# Patient Record
Sex: Male | Born: 1950 | Race: White | Hispanic: No | Smoking: Former smoker
Health system: Southern US, Community
[De-identification: ages and names within clinical notes are randomized; demographics above are authoritative.]

## PROBLEM LIST (undated history)

## (undated) DIAGNOSIS — T888XXA Other specified complications of surgical and medical care, not elsewhere classified, initial encounter: Secondary | ICD-10-CM

## (undated) DIAGNOSIS — I639 Cerebral infarction, unspecified: Secondary | ICD-10-CM

## (undated) DIAGNOSIS — I509 Heart failure, unspecified: Secondary | ICD-10-CM

## (undated) DIAGNOSIS — J449 Chronic obstructive pulmonary disease, unspecified: Secondary | ICD-10-CM

## (undated) DIAGNOSIS — G3183 Dementia with Lewy bodies: Secondary | ICD-10-CM

## (undated) DIAGNOSIS — F0280 Dementia in other diseases classified elsewhere without behavioral disturbance: Secondary | ICD-10-CM

## (undated) DIAGNOSIS — N19 Unspecified kidney failure: Secondary | ICD-10-CM

## (undated) DIAGNOSIS — E119 Type 2 diabetes mellitus without complications: Secondary | ICD-10-CM

## (undated) DIAGNOSIS — F200 Paranoid schizophrenia: Secondary | ICD-10-CM

## (undated) DIAGNOSIS — I10 Essential (primary) hypertension: Secondary | ICD-10-CM

## (undated) HISTORY — PX: ANKLE SURGERY: SHX546

---

## 2010-09-15 DIAGNOSIS — N19 Unspecified kidney failure: Secondary | ICD-10-CM

## 2010-09-15 HISTORY — DX: Unspecified kidney failure: N19

## 2013-08-13 ENCOUNTER — Other Ambulatory Visit (HOSPITAL_COMMUNITY): Payer: Medicaid Other

## 2013-08-13 ENCOUNTER — Ambulatory Visit (HOSPITAL_COMMUNITY)
Admission: AD | Admit: 2013-08-13 | Discharge: 2013-08-13 | Disposition: A | Discharge status: Home / Self Care | Payer: Medicare Other | Source: Other Acute Inpatient Hospital | Attending: Internal Medicine | Admitting: Internal Medicine

## 2013-08-13 ENCOUNTER — Inpatient Hospital Stay
Admission: AD | Admit: 2013-08-13 | Discharge: 2013-10-03 | Disposition: A | Discharge status: Skilled Nursing Facility | Payer: Medicaid Other | Source: Ambulatory Visit | Attending: Internal Medicine | Admitting: Internal Medicine

## 2013-08-13 DIAGNOSIS — J9600 Acute respiratory failure, unspecified whether with hypoxia or hypercapnia: Secondary | ICD-10-CM

## 2013-08-13 DIAGNOSIS — R1310 Dysphagia, unspecified: Secondary | ICD-10-CM | POA: Diagnosis present

## 2013-08-13 DIAGNOSIS — R6339 Other feeding difficulties: Secondary | ICD-10-CM | POA: Diagnosis present

## 2013-08-13 DIAGNOSIS — J189 Pneumonia, unspecified organism: Secondary | ICD-10-CM

## 2013-08-13 DIAGNOSIS — R633 Feeding difficulties: Secondary | ICD-10-CM | POA: Diagnosis present

## 2013-08-13 DIAGNOSIS — Z9911 Dependence on respirator [ventilator] status: Secondary | ICD-10-CM | POA: Insufficient documentation

## 2013-08-13 DIAGNOSIS — R0902 Hypoxemia: Secondary | ICD-10-CM

## 2013-08-13 DIAGNOSIS — I639 Cerebral infarction, unspecified: Secondary | ICD-10-CM

## 2013-08-13 DIAGNOSIS — J90 Pleural effusion, not elsewhere classified: Secondary | ICD-10-CM

## 2013-08-13 DIAGNOSIS — Z930 Tracheostomy status: Secondary | ICD-10-CM

## 2013-08-13 DIAGNOSIS — D689 Coagulation defect, unspecified: Secondary | ICD-10-CM

## 2013-08-13 DIAGNOSIS — J811 Chronic pulmonary edema: Secondary | ICD-10-CM

## 2013-08-13 DIAGNOSIS — J948 Other specified pleural conditions: Secondary | ICD-10-CM

## 2013-08-13 DIAGNOSIS — R4182 Altered mental status, unspecified: Secondary | ICD-10-CM

## 2013-08-13 HISTORY — DX: Dementia with Lewy bodies: G31.83

## 2013-08-13 HISTORY — DX: Heart failure, unspecified: I50.9

## 2013-08-13 HISTORY — DX: Paranoid schizophrenia: F20.0

## 2013-08-13 HISTORY — DX: Essential (primary) hypertension: I10

## 2013-08-13 HISTORY — DX: Chronic obstructive pulmonary disease, unspecified: J44.9

## 2013-08-13 HISTORY — DX: Dementia in other diseases classified elsewhere, unspecified severity, without behavioral disturbance, psychotic disturbance, mood disturbance, and anxiety: F02.80

## 2013-08-13 HISTORY — DX: Unspecified kidney failure: N19

## 2013-08-13 HISTORY — DX: Other specified complications of surgical and medical care, not elsewhere classified, initial encounter: T88.8XXA

## 2013-08-13 HISTORY — DX: Type 2 diabetes mellitus without complications: E11.9

## 2013-08-13 HISTORY — DX: Cerebral infarction, unspecified: I63.9

## 2013-08-13 LAB — BLOOD GAS, ARTERIAL
ACID-BASE EXCESS: 1.4 mmol/L (ref 0.0–2.0)
Bicarbonate: 26.1 mEq/L — ABNORMAL HIGH (ref 20.0–24.0)
FIO2: 0.4 %
LHR: 18 {breaths}/min
MECHVT: 450 mL
O2 Saturation: 99.7 %
PCO2 ART: 48.7 mmHg — AB (ref 35.0–45.0)
PEEP: 5 cmH2O
Patient temperature: 100.3
TCO2: 27.6 mmol/L (ref 0–100)
pH, Arterial: 7.354 (ref 7.350–7.450)
pO2, Arterial: 115 mmHg — ABNORMAL HIGH (ref 80.0–100.0)

## 2013-08-14 ENCOUNTER — Encounter: Payer: Self-pay | Admitting: Pulmonary Disease

## 2013-08-14 DIAGNOSIS — R0902 Hypoxemia: Secondary | ICD-10-CM | POA: Diagnosis not present

## 2013-08-14 DIAGNOSIS — I6350 Cerebral infarction due to unspecified occlusion or stenosis of unspecified cerebral artery: Secondary | ICD-10-CM | POA: Diagnosis not present

## 2013-08-14 DIAGNOSIS — J9600 Acute respiratory failure, unspecified whether with hypoxia or hypercapnia: Secondary | ICD-10-CM

## 2013-08-14 DIAGNOSIS — R4182 Altered mental status, unspecified: Secondary | ICD-10-CM

## 2013-08-14 DIAGNOSIS — I639 Cerebral infarction, unspecified: Secondary | ICD-10-CM

## 2013-08-14 LAB — HEPATIC FUNCTION PANEL
ALT: 22 U/L (ref 0–53)
AST: 23 U/L (ref 0–37)
Albumin: 1.1 g/dL — ABNORMAL LOW (ref 3.5–5.2)
Alkaline Phosphatase: 54 U/L (ref 39–117)
Total Bilirubin: 0.4 mg/dL (ref 0.3–1.2)
Total Protein: 5.3 g/dL — ABNORMAL LOW (ref 6.0–8.3)

## 2013-08-14 LAB — BASIC METABOLIC PANEL
BUN: 28 mg/dL — AB (ref 6–23)
CALCIUM: 7.9 mg/dL — AB (ref 8.4–10.5)
CO2: 23 meq/L (ref 19–32)
CREATININE: 1.06 mg/dL (ref 0.50–1.35)
Chloride: 117 mEq/L — ABNORMAL HIGH (ref 96–112)
GFR calc Af Amer: 86 mL/min — ABNORMAL LOW (ref >90)
GFR calc non Af Amer: 74 mL/min — ABNORMAL LOW (ref >90)
Glucose, Bld: 178 mg/dL — ABNORMAL HIGH (ref 70–99)
Potassium: 3.6 mEq/L — ABNORMAL LOW (ref 3.7–5.3)
Sodium: 151 mEq/L — ABNORMAL HIGH (ref 137–147)

## 2013-08-14 LAB — CBC
HCT: 22.5 % — ABNORMAL LOW (ref 39.0–52.0)
Hemoglobin: 7.1 g/dL — ABNORMAL LOW (ref 13.0–17.0)
MCH: 26 pg (ref 26.0–34.0)
MCHC: 31.6 g/dL (ref 30.0–36.0)
MCV: 82.4 fL (ref 78.0–100.0)
PLATELETS: 280 10*3/uL (ref 150–400)
RBC: 2.73 MIL/uL — ABNORMAL LOW (ref 4.22–5.81)
RDW: 18.2 % — AB (ref 11.5–15.5)
WBC: 11.6 10*3/uL — ABNORMAL HIGH (ref 4.0–10.5)

## 2013-08-14 LAB — TSH: TSH: 1.597 u[IU]/mL (ref 0.350–4.500)

## 2013-08-14 LAB — PROCALCITONIN: PROCALCITONIN: 2.01 ng/mL

## 2013-08-14 LAB — PRO B NATRIURETIC PEPTIDE: PRO B NATRI PEPTIDE: 6346 pg/mL — AB (ref 0–125)

## 2013-08-14 MED FILL — Midazolam HCl Inj 5 MG/5ML (Base Equivalent): INTRAMUSCULAR | Qty: 5 | Status: AC

## 2013-08-14 MED FILL — Fentanyl Citrate Inj 0.05 MG/ML: INTRAMUSCULAR | Qty: 2 | Status: AC

## 2013-08-14 NOTE — Consult Note (Signed)
Name: Ralph Rowe MRN: [id number] DOB: [birth year]    ADMISSION DATE:  08/13/2013 CONSULTATION DATE:  08/14/13  REFERRING MD :  [facility] PRIMARY SERVICE:  [facility]   CHIEF COMPLAINT:  Vent mgmt   BRIEF PATIENT DESCRIPTION: 63yo male with hx COPD, CHF, HTN initially admitted to [facility] with pseudomonas, MRSA, PNA requiring intubation.  Course was c/b CVA, diffuse weakness thought r/t guillain barre and given 3 days IVIG.  Unable to wean, tx to [facility] for further vent weaning and rehab.   SIGNIFICANT EVENTS / STUDIES:    LINES / TUBES: ETT 1/19 >>>  CULTURES: Sputum 1/29>>> BCx2 1/28>>> Urine 1/28>>>  ANTIBIOTICS: Per [facility] MD   HISTORY OF PRESENT ILLNESS: 63yo male with hx COPD, CHF, HTN initially admitted to [facility] with pseudomonas, MRSA, PNA requiring intubation.  Course was c/b CVA, diffuse weakness thought r/t guillain barre and given 3 days IVIG.  Unable to wean, tx to [facility] for further vent weaning and rehab.    PAST MEDICAL HISTORY :  Past Medical History  Diagnosis Date  . Paranoid schizophrenia   . COPD (chronic obstructive pulmonary disease)   . CHF (congestive heart failure)   . Diabetes   . HTN (hypertension)   . CVA (cerebral vascular accident)    No past surgical history on file. Prior to Admission medications   Not on File   Allergies not on file  FAMILY HISTORY:  No family history on file. SOCIAL HISTORY:  has no tobacco, alcohol, and drug history on file.  REVIEW OF SYSTEMS:   Unable, pt sedated on vent   SUBJECTIVE:   VITAL SIGNS:  Reviewed.   PHYSICAL EXAMINATION: General:  Chronically ill appearing, sedated and intubated. Neuro:  Sedated and intubated, moves ext to command. HEENT:  [place]/AT, PERRL, EOM-I and MMM. Neck:  Supple, -thyromegally. Cardiovascular:  RRR, Nl S1/S2 and -M/R/G. Lungs:  Coarse BS diffusely. Abdomen:  Soft, NT, ND and +BS. Musculoskeletal:  -edema and -tenderness. Skin:  Intact.   Recent  Labs Lab 08/14/13 0640  NA 151*  K 3.6*  CL 117*  CO2 23  BUN 28*  CREATININE 1.06  GLUCOSE 178*    Recent Labs Lab 08/14/13 0640  HGB 7.1*  HCT 22.5*  WBC 11.6*  PLT 280   Dg Chest Port 1 View  08/13/2013   CLINICAL DATA:  Assess endotracheal tube placement  EXAM: PORTABLE CHEST - 1 VIEW  COMPARISON:  None.  FINDINGS: The lungs are adequately inflated. There are confluent alveolar densities in the right mid and lower lung and in the left lower lobe. The cardiac silhouette is not enlarged. The pulmonary vascularity is not engorged. The left internal jugular venous catheter tip lies in the midportion of the SVC. The endotracheal tube tip lies 5.7 cm above the crotch of the carina. The esophagogastric tube tip projects off the inferior margin of the image.  IMPRESSION: 1. The endotracheal tube tip appears to be in reasonable position approximately 5.7 cm above the crotch of the carina. 2. Confluent alveolar densities in both lungs are consistent with pneumonia. There is no evidence of CHF.   Electronically Signed   By: David  [place]   On: 08/13/2013 18:22   Dg Abd Portable 1v  08/13/2013   CLINICAL DATA:  Nasogastric tube placement  EXAM: PORTABLE ABDOMEN - 1 VIEW  COMPARISON:  None.  FINDINGS: The nasogastric tube tip in proximal port lie below the level of the GE junction likely in the gastric fundus. The  bowel gas pattern is nonspecific where visualized. Motion artifact limits the study.  IMPRESSION: The nasogastric tube is adequately positioned.   Electronically Signed   By: David  [place]   On: 08/13/2013 17:57    ASSESSMENT / PLAN:  Acute respiratory failure - multifactorial in setting CAP (pseudomonas, MRSA) with underlying COPD and CHF.  Now c/b diffuse weakness and deconditioning r/t ?stroke +/- Guillain Barre s/p IVIG rx.   Suspect weakness most likely r/t stroke.   PLAN -  - Cont PS wean as tol, plan to trach early next week. - Limit sedation as able. - F/u CXR. - BD. -  PT/OT efforts. - Likely needs trach if no significant improvement over next few days with IVIG  - Keep dry as tol  - Consider f/u echo but will defer to cards.   Alyson Reedy, M.D. [facility] Pulmonary/Critical Care Medicine. Pager: [phone]. After hours pager: [phone].

## 2013-08-15 ENCOUNTER — Other Ambulatory Visit (HOSPITAL_COMMUNITY): Payer: Medicaid Other

## 2013-08-15 DIAGNOSIS — J9600 Acute respiratory failure, unspecified whether with hypoxia or hypercapnia: Secondary | ICD-10-CM | POA: Diagnosis not present

## 2013-08-15 DIAGNOSIS — J90 Pleural effusion, not elsewhere classified: Secondary | ICD-10-CM

## 2013-08-15 DIAGNOSIS — J948 Other specified pleural conditions: Secondary | ICD-10-CM

## 2013-08-15 NOTE — Consult Note (Signed)
PULMONARY / CRITICAL CARE MEDICINE  Name: Ralph Rowe MRN: [id number] DOB: Aug 23, 1950    ADMISSION DATE:  08/13/2013 CONSULTATION DATE:  08/14/2013  BRIEF PATIENT DESCRIPTION: 63 yo male with COPD, CHF, HTN initially admitted to [facility] with pseudomonas, MRSA, PNA requiring intubation.  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  Unable to wean, [facility] vent weaning and rehab.   SIGNIFICANT EVENTS / STUDIES:  1/19  Respiratory failure / ETT 1/29 Tension pneumothorax / R chest tube placed  CULTURES: 1/29  Sputum >>> 1/28  Blood >>> 1/28  Urine >>>  ANTIBIOTICS:  SUBJECTIVE:  As above - tension pneumothorax / chest tube placed  PHYSICAL EXAMINATION:  Vital signs reviewed   General:  Appears acutely ill, mechanically ventilated, synchronous Neuro:  Encephalopathic, nonfocal, cough / gag diminished HEENT:  OETT  Cardiovascular:  RRR, no m/r/g Lungs:  Bilateral diminished air entry, R chest tube Abdomen:  Soft, nontender, bowel sounds diminished Musculoskeletal:  Moves all extremities, no edema Skin:  Intact   Recent Labs Lab 08/13/13 1620 08/14/13 0640  HGB  --  7.1*  WBC  --  11.6*  PLT  --  280  NA  --  151*  K  --  3.6*  CL  --  117*  CO2  --  23  GLUCOSE  --  178*  BUN  --  28*  CREATININE  --  1.06  CALCIUM  --  7.9*  AST  --  23  ALT  --  22  ALKPHOS  --  54  BILITOT  --  0.4  PROT  --  5.3*  ALBUMIN  --  1.1*  PHART 7.354  --   PCO2ART 48.7*  --   PO2ART 115.0*  --   HCO3 26.1*  --   O2SAT 99.7  --    CXR:  1/29 >>> Hardware in good position, resolution of tension pneumothorax, bibasilar airspace disease   ASSESSMENT:  Acute respiratory failure Pneumonia ( Pseudomonas, MRSA) COPD, no evidence of exacerbation CHF, with possible exacerbation Tension pneumothorax R, s/p chest tube placement Neuro-muscular weakness ( GBS vs post CVA vs ICU  myopathy / neuropathy )  PLAN:  Goal SpO2>92, pH>7.30 Full mechanical support Minimize sedation as able Wean as tolerated Bronchodilators Steroids are not indicated Antibiotics / diuresis per primary team Continue chest tube to sx while on positive pressure ventilation Needs goals of care discussion as likely to require tracheostomy if full care is desired, however overall prognosis of functional recovery is very popor  I have personally obtained history, examined patient, evaluated and interpreted laboratory and imaging results, reviewed medical records, formulated assessment / plan and placed orders.  Lonia FarberZUBELEVITSKIY, Keanan Melander, MD Pulmonary and Critical Care Medicine [facility] Pager: [phone]  08/15/2013, 11:51 AM

## 2013-08-15 NOTE — Procedures (Signed)
Chest Tube Insertion Procedure Note  Indications:  Clinically significant Pneumothorax  Pre-operative Diagnosis: Pneumothorax  Post-operative Diagnosis: Pneumothorax  Procedure Details  Informed consent was obtained for the procedure, including sedation.  Risks of lung perforation, hemorrhage, arrhythmia, and adverse drug reaction were discussed.   After sterile skin prep, using standard technique, a 28 French tube was placed in the right lateral 8th rib space.  Findings: Tension pneumothorax with vigorous air rush. Bloody fluid drainage of approx 1 liter  Estimated Blood Loss:  Minimal         Specimens:  None              Complications:  None; patient tolerated the procedure well.         Condition: stable  Attending Attestation: I performed the procedure.   Ralph Fischeravid Simonds, MD ; [facility] service Mobile [phone].  After 5:30 PM or weekends, call [phone]

## 2013-08-16 ENCOUNTER — Other Ambulatory Visit (HOSPITAL_COMMUNITY): Payer: Medicaid Other

## 2013-08-16 LAB — BASIC METABOLIC PANEL
BUN: 28 mg/dL — ABNORMAL HIGH (ref 6–23)
CALCIUM: 8 mg/dL — AB (ref 8.4–10.5)
CHLORIDE: 114 meq/L — AB (ref 96–112)
CO2: 27 meq/L (ref 19–32)
Creatinine, Ser: 1 mg/dL (ref 0.50–1.35)
GFR calc non Af Amer: 79 mL/min — ABNORMAL LOW (ref >90)
Glucose, Bld: 182 mg/dL — ABNORMAL HIGH (ref 70–99)
Potassium: 3.7 mEq/L (ref 3.7–5.3)
SODIUM: 150 meq/L — AB (ref 137–147)

## 2013-08-16 LAB — BLOOD GAS, ARTERIAL
ACID-BASE EXCESS: 1.1 mmol/L (ref 0.0–2.0)
Bicarbonate: 26.9 mEq/L — ABNORMAL HIGH (ref 20.0–24.0)
FIO2: 0.4 %
LHR: 18 {breaths}/min
O2 SAT: 98.5 %
PATIENT TEMPERATURE: 99.5
PEEP/CPAP: 5 cmH2O
TCO2: 28.6 mmol/L (ref 0–100)
VT: 0.45 mL
pCO2 arterial: 57.5 mmHg (ref 35.0–45.0)
pH, Arterial: 7.294 — ABNORMAL LOW (ref 7.350–7.450)
pO2, Arterial: 124 mmHg — ABNORMAL HIGH (ref 80.0–100.0)

## 2013-08-16 LAB — CBC
HCT: 23.6 % — ABNORMAL LOW (ref 39.0–52.0)
Hemoglobin: 7.6 g/dL — ABNORMAL LOW (ref 13.0–17.0)
MCH: 26.8 pg (ref 26.0–34.0)
MCHC: 32.2 g/dL (ref 30.0–36.0)
MCV: 83.1 fL (ref 78.0–100.0)
PLATELETS: 390 10*3/uL (ref 150–400)
RBC: 2.84 MIL/uL — ABNORMAL LOW (ref 4.22–5.81)
RDW: 18.2 % — AB (ref 11.5–15.5)
WBC: 13.3 10*3/uL — AB (ref 4.0–10.5)

## 2013-08-17 LAB — MALARIA SMEAR

## 2013-08-17 LAB — HIV ANTIBODY (ROUTINE TESTING W REFLEX): HIV: NONREACTIVE

## 2013-08-18 DIAGNOSIS — R4182 Altered mental status, unspecified: Secondary | ICD-10-CM | POA: Diagnosis not present

## 2013-08-18 DIAGNOSIS — J90 Pleural effusion, not elsewhere classified: Secondary | ICD-10-CM | POA: Diagnosis not present

## 2013-08-18 DIAGNOSIS — J9600 Acute respiratory failure, unspecified whether with hypoxia or hypercapnia: Secondary | ICD-10-CM | POA: Diagnosis not present

## 2013-08-18 DIAGNOSIS — R0902 Hypoxemia: Secondary | ICD-10-CM | POA: Diagnosis not present

## 2013-08-18 LAB — BLOOD GAS, ARTERIAL
Acid-Base Excess: 4.5 mmol/L — ABNORMAL HIGH (ref 0.0–2.0)
BICARBONATE: 28.8 meq/L — AB (ref 20.0–24.0)
FIO2: 0.4 %
O2 Saturation: 94.3 %
PATIENT TEMPERATURE: 98
PEEP: 5 cmH2O
RATE: 18 resp/min
TCO2: 30.2 mmol/L (ref 0–100)
VT: 500 mL
pCO2 arterial: 44.9 mmHg (ref 35.0–45.0)
pH, Arterial: 7.422 (ref 7.350–7.450)
pO2, Arterial: 67.5 mmHg — ABNORMAL LOW (ref 80.0–100.0)

## 2013-08-18 LAB — CBC
HCT: 20.9 % — ABNORMAL LOW (ref 39.0–52.0)
Hemoglobin: 6.5 g/dL — CL (ref 13.0–17.0)
MCH: 25.9 pg — ABNORMAL LOW (ref 26.0–34.0)
MCHC: 31.1 g/dL (ref 30.0–36.0)
MCV: 83.3 fL (ref 78.0–100.0)
Platelets: 348 10*3/uL (ref 150–400)
RBC: 2.51 MIL/uL — AB (ref 4.22–5.81)
RDW: 18.3 % — ABNORMAL HIGH (ref 11.5–15.5)
WBC: 10 10*3/uL (ref 4.0–10.5)

## 2013-08-18 LAB — ABO/RH: ABO/RH(D): O POS

## 2013-08-18 LAB — BASIC METABOLIC PANEL
BUN: 29 mg/dL — ABNORMAL HIGH (ref 6–23)
CO2: 27 meq/L (ref 19–32)
CREATININE: 0.9 mg/dL (ref 0.50–1.35)
Calcium: 7.6 mg/dL — ABNORMAL LOW (ref 8.4–10.5)
Chloride: 112 mEq/L (ref 96–112)
GFR calc Af Amer: >90 mL/min (ref >90)
GFR calc non Af Amer: >90 mL/min (ref >90)
GLUCOSE: 233 mg/dL — AB (ref 70–99)
Potassium: 4.5 mEq/L (ref 3.7–5.3)
SODIUM: 148 meq/L — AB (ref 137–147)

## 2013-08-18 LAB — CULTURE, BLOOD (ROUTINE X 2)

## 2013-08-18 LAB — PREPARE RBC (CROSSMATCH)

## 2013-08-18 LAB — PNEUMOCYSTIS JIROVECI SMEAR BY DFA: Pneumocystis jiroveci Ag: NEGATIVE

## 2013-08-18 NOTE — Progress Notes (Signed)
PULMONARY / CRITICAL CARE MEDICINE  Name: Ralph Rowe MRN: [id number] DOB: 1950-10-17    ADMISSION DATE:  08/13/2013 CONSULTATION DATE:  08/14/2013  BRIEF PATIENT DESCRIPTION: 63 yo male with COPD, CHF, HTN initially admitted [facility] with pseudomonas, MRSA, PNA requiring intubation.  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  Unable to wean, [facility] and [facility].   SIGNIFICANT EVENTS / STUDIES:  1/19  Respiratory failure / ETT 1/29 Tension pneumothorax / R chest tube placed  CULTURES: 1/29  Sputum >>> 1/28  Blood >>> 1/28  Urine >>>  ANTIBIOTICS:  SUBJECTIVE:  As above - tension pneumothorax / chest tube placed  PHYSICAL EXAMINATION:  Vital signs reviewed   General:  Appears acutely ill, mechanically ventilated, synchronous Neuro:  Encephalopathic, nonfocal, cough / gag diminished HEENT:  OETT  Cardiovascular:  RRR, no m/r/g Lungs:  Bilateral diminished air entry, R chest tube Abdomen:  Soft, nontender, bowel sounds diminished Musculoskeletal:  Moves all extremities, no edema Skin:  Intact   Recent Labs Lab 08/13/13 1620  08/14/13 0640 08/16/13 0500 08/16/13 1253 08/18/13 0510 08/18/13 0540  HGB  --   --  7.1* 7.6*  --   --  6.5*  WBC  --   --  11.6* 13.3*  --   --  10.0  PLT  --   --  280 390  --   --  348  NA  --   --  151* 150*  --   --  148*  K  --   < > 3.6* 3.7  --   --  4.5  CL  --   --  117* 114*  --   --  112  CO2  --   --  23 27  --   --  27  GLUCOSE  --   --  178* 182*  --   --  233*  BUN  --   --  28* 28*  --   --  29*  CREATININE  --   --  1.06 1.00  --   --  0.90  CALCIUM  --   --  7.9* 8.0*  --   --  7.6*  AST  --   --  23  --   --   --   --   ALT  --   --  22  --   --   --   --   ALKPHOS  --   --  54  --   --   --   --   BILITOT  --   --  0.4  --   --   --   --   PROT  --   --  5.3*  --   --   --   --   ALBUMIN  --   --  1.1*   --   --   --   --   PHART 7.354  --   --   --  7.294* 7.422  --   PCO2ART 48.7*  --   --   --  57.5* 44.9  --   PO2ART 115.0*  --   --   --  124.0* 67.5*  --   HCO3 26.1*  --   --   --  26.9* 28.8*  --   O2SAT 99.7  --   --   --  98.5 94.3  --   < > = values in this interval not displayed. CXR:  1/29 >>> Hardware in good position, resolution of tension pneumothorax, bibasilar airspace disease   ASSESSMENT:  Acute respiratory failure Pneumonia ( Pseudomonas, MRSA) COPD, no evidence of exacerbation CHF, with possible exacerbation Tension pneumothorax R, s/p chest tube placement Neuro-muscular weakness ( GBS vs post CVA vs ICU myopathy / neuropathy )  PLAN: - Goal SpO2>92, pH>7.30 - Full mechanical support until trach. - Plan trach on Wednesday at 10:30 AM. - Minimize sedation as able, specially post trach. - Bronchodilators. - Steroids are not indicated. - Antibiotics / diuresis per primary team. - Continue chest tube to sx while on positive pressure ventilation.  CC time 35 min.  I have personally obtained history, examined patient, evaluated and interpreted laboratory and imaging results, reviewed medical records, formulated assessment / plan and placed orders.  Alyson Reedy, M.D. [facility] Pulmonary/Critical Care Medicine. Pager: [phone]. After hours pager: [phone].

## 2013-08-19 LAB — BASIC METABOLIC PANEL
BUN: 27 mg/dL — AB (ref 6–23)
CALCIUM: 7.3 mg/dL — AB (ref 8.4–10.5)
CHLORIDE: 110 meq/L (ref 96–112)
CO2: 27 meq/L (ref 19–32)
Creatinine, Ser: 0.81 mg/dL (ref 0.50–1.35)
GFR calc Af Amer: >90 mL/min (ref >90)
GFR calc non Af Amer: >90 mL/min (ref >90)
GLUCOSE: 211 mg/dL — AB (ref 70–99)
Potassium: 4.6 mEq/L (ref 3.7–5.3)
Sodium: 146 mEq/L (ref 137–147)

## 2013-08-19 LAB — CBC
HEMATOCRIT: 21.9 % — AB (ref 39.0–52.0)
Hemoglobin: 7.1 g/dL — ABNORMAL LOW (ref 13.0–17.0)
MCH: 27.5 pg (ref 26.0–34.0)
MCHC: 32.4 g/dL (ref 30.0–36.0)
MCV: 84.9 fL (ref 78.0–100.0)
Platelets: 257 10*3/uL (ref 150–400)
RBC: 2.58 MIL/uL — AB (ref 4.22–5.81)
RDW: 17.3 % — ABNORMAL HIGH (ref 11.5–15.5)
WBC: 9.5 10*3/uL (ref 4.0–10.5)

## 2013-08-19 LAB — PREPARE RBC (CROSSMATCH)

## 2013-08-19 LAB — CULTURE, RESPIRATORY W GRAM STAIN

## 2013-08-19 LAB — CULTURE, RESPIRATORY

## 2013-08-20 ENCOUNTER — Other Ambulatory Visit (HOSPITAL_COMMUNITY): Payer: Medicaid Other

## 2013-08-20 ENCOUNTER — Encounter (HOSPITAL_COMMUNITY): Payer: Medicaid Other

## 2013-08-20 DIAGNOSIS — R4182 Altered mental status, unspecified: Secondary | ICD-10-CM | POA: Diagnosis not present

## 2013-08-20 DIAGNOSIS — Z930 Tracheostomy status: Secondary | ICD-10-CM

## 2013-08-20 DIAGNOSIS — I6350 Cerebral infarction due to unspecified occlusion or stenosis of unspecified cerebral artery: Secondary | ICD-10-CM | POA: Diagnosis not present

## 2013-08-20 DIAGNOSIS — J9600 Acute respiratory failure, unspecified whether with hypoxia or hypercapnia: Secondary | ICD-10-CM | POA: Diagnosis not present

## 2013-08-20 DIAGNOSIS — J90 Pleural effusion, not elsewhere classified: Secondary | ICD-10-CM | POA: Diagnosis not present

## 2013-08-20 LAB — TYPE AND SCREEN
ABO/RH(D): O POS
ANTIBODY SCREEN: NEGATIVE
UNIT DIVISION: 0
UNIT DIVISION: 0
UNIT DIVISION: 0
Unit division: 0

## 2013-08-20 LAB — CBC
HCT: 27.1 % — ABNORMAL LOW (ref 39.0–52.0)
HEMOGLOBIN: 9 g/dL — AB (ref 13.0–17.0)
MCH: 28 pg (ref 26.0–34.0)
MCHC: 33.2 g/dL (ref 30.0–36.0)
MCV: 84.2 fL (ref 78.0–100.0)
Platelets: 229 10*3/uL (ref 150–400)
RBC: 3.22 MIL/uL — AB (ref 4.22–5.81)
RDW: 16.8 % — AB (ref 11.5–15.5)
WBC: 11.5 10*3/uL — AB (ref 4.0–10.5)

## 2013-08-20 LAB — BASIC METABOLIC PANEL
BUN: 22 mg/dL (ref 6–23)
CHLORIDE: 104 meq/L (ref 96–112)
CO2: 29 meq/L (ref 19–32)
Calcium: 7.2 mg/dL — ABNORMAL LOW (ref 8.4–10.5)
Creatinine, Ser: 0.64 mg/dL (ref 0.50–1.35)
GFR calc non Af Amer: >90 mL/min (ref >90)
GLUCOSE: 201 mg/dL — AB (ref 70–99)
POTASSIUM: 4.4 meq/L (ref 3.7–5.3)
Sodium: 141 mEq/L (ref 137–147)

## 2013-08-20 LAB — CULTURE, BLOOD (ROUTINE X 2): CULTURE: NO GROWTH

## 2013-08-20 LAB — PROTIME-INR
INR: 1.36 (ref 0.00–1.49)
Prothrombin Time: 16.4 seconds — ABNORMAL HIGH (ref 11.6–15.2)

## 2013-08-20 LAB — APTT: APTT: 51 s — AB (ref 24–37)

## 2013-08-20 NOTE — Procedures (Signed)
Bedside Tracheostomy Insertion Procedure Note   Patient Details:   Name: Ralph Rowe DOB: 15-Apr-1951 MRN: [id number]  Procedure: Tracheostomy  Pre Procedure Assessment: ET Tube Size: 8.0 ET Tube secured at lip (cm):26 Bite block in place: Yes Breath Sounds: Rhonch  Post Procedure Assessment: There were no vitals taken for this visit. O2 sats: stable throughout Complications: No apparent complications Patient did tolerate procedure well Tracheostomy Brand:Shiley Tracheostomy Style:Cuffed Tracheostomy Size: 8.0 Tracheostomy Secured JXB:[id number]:Sutures Tracheostomy Placement Confirmation:Trach cuff visualized and in place and Chest X ray ordered for placement    Leonard DowningFerguson, Hellena Pridgen Steele 08/20/2013, 2:34 PM

## 2013-08-20 NOTE — Procedures (Signed)
Bronchoscopy Procedure Note Lolly MustacheLarry Mckey [id number] Sep 22, 1950  Procedure: Bronchoscopy Indications: Diagnostic evaluation of the airways, Obtain specimens for culture and/or other diagnostic studies and Remove secretions  Procedure Details Consent: Risks of procedure as well as the alternatives and risks of each were explained to the (patient/caregiver).  Consent for procedure obtained. Time Out: Verified patient identification, verified procedure, site/side was marked, verified correct patient position, special equipment/implants available, medications/allergies/relevent history reviewed, required imaging and test results available.  Performed  In preparation for procedure, patient was given 100% FiO2 and bronchoscope lubricated. Sedation: Benzodiazepines  Airway entered and the following bronchi were examined: RUL, RML, RLL, LUL, LLL and Bronchi.   Procedures performed: Brushings performed  - no Bronchoscope removed.  , Patient placed back on 100% FiO2 at conclusion of procedure.    Evaluation Hemodynamic Status: BP stable throughout; O2 sats: stable throughout Patient's Current Condition: stable Specimens:  Sent purulent fluid Complications: No apparent complications Patient did tolerate procedure well.   Nelda BucksFEINSTEIN,Adeleine Pask J. 08/20/2013   1. Pre procedure, thick dark tan secretions obstructing mainstem's and all lobes, suctioned clear 2. BAL RLL dark thick pus 3. Noted placement needle, wire, dilator punch and progressive and then trach No apparent complication Then placed bronch through new trach, no bleeding, placed 4 cm above carina  Mcarthur Rossettianiel J. Tyson AliasFeinstein, MD, FACP Pgr: [phone] [place] Pulmonary & Critical Care

## 2013-08-20 NOTE — Procedures (Signed)
Bedside Percutaneous Tracheostomy Placement  Patient sedated and paralyzed then positioned.  Area cleaned, lidocaine/epi injected.  Skin incision done followed by blunt dissection.  Airway palpated and entered, visualized bronchoscopically.  Airway crushed and dilated.  Size 8 cuffed shiley trach placed without difficulty.  Minimal blood loss.  Visualized bronchoscopically well above the carina.  CXR ordered.  Alyson ReedyWesam G. Yacoub, M.D. [facility] Pulmonary/Critical Care Medicine. Pager: [phone]. After hours pager: [phone].

## 2013-08-20 NOTE — Progress Notes (Signed)
PULMONARY / CRITICAL CARE MEDICINE  Name: Ralph Rowe MRN: [id number] DOB: 08/31/1950    ADMISSION DATE:  08/13/2013 CONSULTATION DATE:  08/14/2013  BRIEF PATIENT DESCRIPTION: 63 yo male with COPD, CHF, HTN initially admitted to [facility] with pseudomonas, MRSA, PNA requiring intubation.  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  Unable to wean, transferred to [facility] for further vent weaning and rehab.   SIGNIFICANT EVENTS / STUDIES:  1/19  Respiratory failure / ETT 1/29 Tension pneumothorax / R chest tube placed  CULTURES: 1/29  Sputum >>> 1/28  Blood >>> 1/28  Urine >>>  ANTIBIOTICS:  SUBJECTIVE: Failed weaning, worsening PTX on CXR.  PHYSICAL EXAMINATION:  Vital signs reviewed   General:  Appears acutely ill, mechanically ventilated, synchronous Neuro:  Encephalopathic, nonfocal, cough / gag diminished HEENT:  OETT  Cardiovascular:  RRR, no m/r/g Lungs:  Bilateral diminished air entry, R chest tube Abdomen:  Soft, nontender, bowel sounds diminished Musculoskeletal:  Moves all extremities, no edema Skin:  Intact   Recent Labs Lab 08/13/13 1620  08/14/13 0640 08/16/13 0500 08/16/13 1253 08/18/13 0510 08/18/13 0540 08/19/13 0500 08/20/13 0820  HGB  --   --  7.1* 7.6*  --   --  6.5* 7.1* 9.0*  WBC  --   --  11.6* 13.3*  --   --  10.0 9.5 11.5*  PLT  --   < > 280 390  --   --  348 257 229  NA  --   --  151* 150*  --   --  148* 146 141  K  --   < > 3.6* 3.7  --   --  4.5 4.6 4.4  CL  --   --  117* 114*  --   --  112 110 104  CO2  --   --  23 27  --   --  27 27 29   GLUCOSE  --   --  178* 182*  --   --  233* 211* 201*  BUN  --   --  28* 28*  --   --  29* 27* 22  CREATININE  --   --  1.06 1.00  --   --  0.90 0.81 0.64  CALCIUM  --   --  7.9* 8.0*  --   --  7.6* 7.3* 7.2*  AST  --   --  23  --   --   --   --   --   --   ALT  --   --  22  --   --   --   --   --   --   ALKPHOS  --   --  54   --   --   --   --   --   --   BILITOT  --   --  0.4  --   --   --   --   --   --   PROT  --   --  5.3*  --   --   --   --   --   --   ALBUMIN  --   --  1.1*  --   --   --   --   --   --   INR  --   --   --   --   --   --   --   --  1.36  APTT  --   --   --   --   --   --   --   --  51*  PHART 7.354  --   --   --  7.294* 7.422  --   --   --   PCO2ART 48.7*  --   --   --  57.5* 44.9  --   --   --   PO2ART 115.0*  --   --   --  124.0* 67.5*  --   --   --   HCO3 26.1*  --   --   --  26.9* 28.8*  --   --   --   O2SAT 99.7  --   --   --  98.5 94.3  --   --   --   < > = values in this interval not displayed. CXR:  1/29 >>> Hardware in good position, resolution of tension pneumothorax, bibasilar airspace disease   ASSESSMENT:  Acute respiratory failure Pneumonia ( Pseudomonas, MRSA) COPD, no evidence of exacerbation CHF, with possible exacerbation Tension pneumothorax R, s/p chest tube placement Neuro-muscular weakness ( GBS vs post CVA vs ICU myopathy / neuropathy )  PLAN: - Goal SpO2>92, pH>7.30 - Trach today. - Begin weaning in AM after trach today. - Increase suction to 30 on CT then repeat CXR for PTX. - Minimize sedation as able, specially post trach. - Bronchodilators. - Steroids are not indicated. - Antibiotics / diuresis per primary team.  CC time 35 min.  I have personally obtained history, examined patient, evaluated and interpreted laboratory and imaging results, reviewed medical records, formulated assessment / plan and placed orders.  Alyson ReedyWesam G. Zamaria Brazzle, M.D. [facility] Pulmonary/Critical Care Medicine. Pager: [phone]. After hours pager: [phone].

## 2013-08-21 ENCOUNTER — Encounter: Payer: Self-pay | Admitting: Physician Assistant

## 2013-08-21 ENCOUNTER — Other Ambulatory Visit (HOSPITAL_COMMUNITY): Payer: Medicaid Other

## 2013-08-21 DIAGNOSIS — I6350 Cerebral infarction due to unspecified occlusion or stenosis of unspecified cerebral artery: Secondary | ICD-10-CM

## 2013-08-21 DIAGNOSIS — R633 Feeding difficulties, unspecified: Secondary | ICD-10-CM

## 2013-08-21 DIAGNOSIS — R4182 Altered mental status, unspecified: Secondary | ICD-10-CM

## 2013-08-21 LAB — CULTURE, RESPIRATORY

## 2013-08-21 LAB — CULTURE, RESPIRATORY W GRAM STAIN

## 2013-08-21 NOTE — Consult Note (Signed)
Patient seen, examined, and I agree with the above documentation, including the assessment and plan. CT also to r/o abdominal varices Follow-up coags which need to improve, with FFP at a minimum, before PEG given risk of bleeding with this procedure

## 2013-08-21 NOTE — Consult Note (Signed)
[facility] Consult: 2:20 PM 08/21/2013  LOS: 8 days    Referring Provider: Dr Zonia Kief  Primary Care Physician:  No PCP Per Patient Primary Gastroenterologist:  Dr. Fabian Sharp in [place]   Reason for Consultation:  Requesting PEG   HPI: Ralph Rowe is a 63 y.o. male.  63 yo male with COPD, CHF, HTN, wheelchair bound, Lewey body dementia.  Admitted 1/8 - 1/28 to [facility] with pseudomonas, MRSA, PNA requiring intubation, rhabdo, + H Fu treated with Tamiflu. . 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 Afib with RVR treated with amiodarone and cardioversion.  Coagulopathy with INR 1.7 per notes.  No anticoagulants in use.  Dark material per NGT was evaluated with EGD showing gastritis, esophagitis, HH.  Treated with PPI.  Unable to wean, transferred to [facility] for further vent weaning and rehab.  Since transfer to [facility] had bronch as well as trach placement 2/4 for worsening PTX, failed weaning.  Growing pseudomonas from sputum. Has required 4 PRBCs (08/18/12) since [facility] Admission.  He requires feeding tube due to protein malnutrition.   IR reluctant to place G tube due to hx of gastritis, GI bleed.  He also has reduceible umbilical hernia on exam.   Charting notes hx of ETOH abuse which ceased in 2011 when he entered long term care facility.  No mention of liver disease or of ascites.   Had been tolerating tube feeds but feeding tube dislodged during bronch/trach 2/4 and was never replaced as plan had been for IR to place G tube today. As mentioned, IR does not feel safe to place G tube. Currently pt about 24 hours without feeding tube, supporting with IVF    Past Medical History  Diagnosis Date  .  Paranoid schizophrenia     electroconvulsive therapy March 2012  . COPD (chronic obstructive pulmonary disease)   . CHF (congestive heart failure)   . Diabetes   . HTN (hypertension)   . CVA (cerebral vascular accident)   . Renal failure as complication of care 09/2010    ARF due to lithium toxicity.   . Dementia with Lewy bodies     Past Surgical History  Procedure Laterality Date  . Ankle surgery Right 2013 . 2014    Prior to Admission medications   Not on File    Scheduled Meds: Fentanyl IV Novolog , Levemir insulin Atorvastatin Clonazepam Digoxin combivent inhaler Levothyroxine Metoprolol Renal MVI Olanzapine Protonix 40 mg IV BID Rivastigmine Vitamin D Zyvox    Allergies as of 08/13/2013  . (Not on File)    Family History Parents:  Senile dementia and Parkinsons  History   Social History  . Marital Status: Unknown    Spouse Name: N/A    Number of Children: N/A  . Years of Education: N/A   Occupational History  . Not on file.   Social History Main Topics  . Smoking status: Not on file  . Smokeless tobacco: Not on file  . Alcohol Use: Not  on file  . Drug Use: Not on file  . Sexual Activity: Not on file    REVIEW OF SYSTEMS: Constitutional:  Not able to ascertain from charts but wheelchair bound before Jan 2015 ENT:  No nose bleeds Pulm:  Per HPI CV:  No palpitations, no LE edema.  GU:  No hematuria, no frequency GI:  Per HPI Heme:  Not known   Transfusions: un known Neuro:  No headaches, no peripheral tingling or numbness Derm:  No itching, no rash or sores.  Endocrine:  No sweats or chills.  No polyuria or dysuria Immunization:  Flu shot in 2014 Travel:  None beyond [place]    PHYSICAL EXAM: Vital signs in last 24 hours: 97.8   114/46   Pulse 98   resp 17  sats  General: comfortable, trach/intubated WM.  Not toxic Head:  No asymmetry  Eyes:  No icterus or pallor Ears:  Does not appear to be HOH  Nose:  No discharge, panda  tube in place Mouth:  No bleeding Neck:  Trach in place, no bleeding Lungs:  Clear in front.  No labored respiration Heart: RRR Abdomen:  Obese, ND, no shifting dullness, no HSM.  Reducible umbilical hernia.  Active BS.   Rectal: deferred   Musc/Skeltl: deformity in both feet, look like Charcot-type structural deformities.  Extremities:  Non pitting edema in arms and hands  Neurologic:   Not able to wiggle fingers on right hand Skin:  Macerated fungal rash in groin, scrtoum Tattoos:  None seen Nodes:  No cervical adenopathy   Psych:  Relaxed.  Both wrist tied with soft restraints.   Intake/Output from previous day:   Intake/Output this shift:    LAB RESULTS:  Recent Labs  08/19/13 0500 08/20/13 0820  WBC 9.5 11.5*  HGB 7.1* 9.0*  HCT 21.9* 27.1*  PLT 257 229   BMET Lab Results  Component Value Date   NA 141 08/20/2013   NA 146 08/19/2013   NA 148* 08/18/2013   K 4.4 08/20/2013   K 4.6 08/19/2013   K 4.5 08/18/2013   CL 104 08/20/2013   CL 110 08/19/2013   CL 112 08/18/2013   CO2 29 08/20/2013   CO2 27 08/19/2013   CO2 27 08/18/2013   GLUCOSE 201* 08/20/2013   GLUCOSE 211* 08/19/2013   GLUCOSE 233* 08/18/2013   BUN 22 08/20/2013   BUN 27* 08/19/2013   BUN 29* 08/18/2013   CREATININE 0.64 08/20/2013   CREATININE 0.81 08/19/2013   CREATININE 0.90 08/18/2013   CALCIUM 7.2* 08/20/2013   CALCIUM 7.3* 08/19/2013   CALCIUM 7.6* 08/18/2013   LFT No results found for this basename: PROT, ALBUMIN, AST, ALT, ALKPHOS, BILITOT, BILIDIR, IBILI,  in the last 72 hours PT/INR Lab Results  Component Value Date   INR 1.36 08/20/2013       Protime                   16.4      PTT                         51   RADIOLOGY STUDIES: Dg Chest Port 1 View  08/20/2013   CLINICAL DATA:  COPD.  EXAM: PORTABLE CHEST - 1 VIEW  COMPARISON:  DG CHEST 1V PORT dated 08/20/2013  FINDINGS: Tracheostomy tube noted in good anatomic position. Left IJ line and right chest tube in stable position. NG tube removed. Right base pneumothorax is  again noted. This appears less prominent on today's exam. A separate chest tube over the right base may be present. The lower chest is incompletely imaged. Dense atelectasis and/or infiltrates present in the right lower lobe. There is cardiomegaly with bilateral pulmonary alveolar infiltrates and pulmonary venous congestion suggesting congestive heart failure. Small left pleural effusion may be present.  IMPRESSION: 1. Persistent right base pneumothorax, slightly smaller than prior exam. Right upper chest tube noted in stable position. It cannot be determined if a second chest tube is present over the right lower chest. The lower portions of the chest not completely imaged. 2. Dense right base atelectasis. 3. Findings suggesting congestive heart failure with bilateral pulmonary edema and pleural effusions. Superimposed pneumonia may be present . 4. Tracheostomy tube and central line in stable position. NG tube has been removed.   Electronically Signed   By: Maisie Fushomas  Register   On: 08/20/2013 16:18   Dg Chest Port 1 View  08/20/2013   CLINICAL DATA:  Post tracheotomy  EXAM: PORTABLE CHEST - 1 VIEW  COMPARISON:  Portable exam 1200 hr compared to 08/16/2013  FINDINGS: New tracheostomy tube with tip projecting 4.6 cm above carina.  Left jugular central venous catheter tip projecting over SVC.  Nasogastric tube extends only to the distal esophagus, recommend advancing tube 15 cm to place proximal side-port at the stomach.  Right thoracostomy tube present.  Stable heart size.  Pulmonary vascular congestion.  Extensive bibasilar opacities again identified.  New pneumothorax at right base with significant atelectasis of the lower right lung.  No mediastinal shift/tension.  Bones unremarkable.  IMPRESSION: New large right basilar pneumothorax despite right thoracostomy tube with significant atelectasis at lower right lung but no evidence of tension pneumothorax/mediastinal shift.  Persistent bibasilar infiltrates  Tip of  nasogastric tube projects over the distal esophagus, recommend advancing tube 15 cm to place proximal slight poor within the stomach.  Critical Value/emergent results were called by telephone at the time of interpretation on 08/20/2013 at 12:19 PM to Dr. Sharyon MedicusHijazi, who verbally acknowledged these results.   Electronically Signed   By: Ulyses SouthwardMark  Boles M.D.   On: 08/20/2013 12:21    ENDOSCOPIC STUDIES: 07/2012 in [place], per HPI.  Actual report not in records.   IMPRESSION:   *  Dysphagia  *  Transfusion requiring anemia.  4 units since admission 1/28.   *  Hem + NG aspirate, EGD 07/2013 with esophagitis, gastritis, HH per [facility].   *  Vent dependent resp failure post HCAP/MRSA/pseudomonas:  S/p Trach.  Chest tube for PTX  *  Coagulopathy.  No mention of liver disease, chart mentions hx of ETOH abuse before 2011, no mention of portal gastropathy or varices in [place] records.   *  Umbilical hernia, reducible  *  Candidal rash in groin  *  Lewy body dementia, paranoid pcizophrenia     PLAN:     *  Give subq vitamin K and recheck coags tomorrow *  Obtain CT scan to image abdomen:  Assess umbilical hernia, rule out ascites   Jennye MoccasinSarah Gevork Ayyad  08/21/2013, 2:20 PM Pager: [phone]

## 2013-08-22 ENCOUNTER — Other Ambulatory Visit (HOSPITAL_COMMUNITY): Payer: Medicaid Other

## 2013-08-22 DIAGNOSIS — D689 Coagulation defect, unspecified: Secondary | ICD-10-CM

## 2013-08-22 DIAGNOSIS — Z930 Tracheostomy status: Secondary | ICD-10-CM

## 2013-08-22 DIAGNOSIS — R0902 Hypoxemia: Secondary | ICD-10-CM

## 2013-08-22 LAB — CULTURE, BLOOD (ROUTINE X 2)
CULTURE: NO GROWTH
CULTURE: NO GROWTH

## 2013-08-22 LAB — PROTIME-INR
INR: 1.52 — ABNORMAL HIGH (ref 0.00–1.49)
PROTHROMBIN TIME: 17.9 s — AB (ref 11.6–15.2)

## 2013-08-22 LAB — BASIC METABOLIC PANEL
BUN: 14 mg/dL (ref 6–23)
CHLORIDE: 96 meq/L (ref 96–112)
CO2: 27 meq/L (ref 19–32)
Calcium: 7.4 mg/dL — ABNORMAL LOW (ref 8.4–10.5)
Creatinine, Ser: 0.61 mg/dL (ref 0.50–1.35)
GFR calc Af Amer: >90 mL/min (ref >90)
GFR calc non Af Amer: >90 mL/min (ref >90)
Glucose, Bld: 74 mg/dL (ref 70–99)
POTASSIUM: 4.4 meq/L (ref 3.7–5.3)
SODIUM: 136 meq/L — AB (ref 137–147)

## 2013-08-22 LAB — APTT: APTT: 74 s — AB (ref 24–37)

## 2013-08-22 LAB — CBC
HCT: 28.2 % — ABNORMAL LOW (ref 39.0–52.0)
Hemoglobin: 9.2 g/dL — ABNORMAL LOW (ref 13.0–17.0)
MCH: 28.4 pg (ref 26.0–34.0)
MCHC: 32.6 g/dL (ref 30.0–36.0)
MCV: 87 fL (ref 78.0–100.0)
PLATELETS: 233 10*3/uL (ref 150–400)
RBC: 3.24 MIL/uL — AB (ref 4.22–5.81)
RDW: 17.7 % — ABNORMAL HIGH (ref 11.5–15.5)
WBC: 20.5 10*3/uL — ABNORMAL HIGH (ref 4.0–10.5)

## 2013-08-22 LAB — CULTURE, RESPIRATORY

## 2013-08-22 LAB — CULTURE, RESPIRATORY W GRAM STAIN

## 2013-08-22 MED ORDER — IOHEXOL 300 MG/ML  SOLN
100.0000 mL | Freq: Once | INTRAMUSCULAR | Status: AC | PRN
  Administered 2013-08-22: 80 mL via INTRAVENOUS

## 2013-08-22 NOTE — Progress Notes (Signed)
PULMONARY / CRITICAL CARE MEDICINE  Name: Ralph Rowe MRN: [id number] DOB: 02/17/1951    ADMISSION DATE:  08/13/2013 CONSULTATION DATE:  08/14/2013  BRIEF PATIENT DESCRIPTION: 63 yo male with COPD, CHF, HTN [facility] with pseudomonas, MRSA, PNA requiring intubation.  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  Unable to wean, [facility] we[facility] and rehab.   SIGNIFICANT EVENTS / STUDIES:  1/19  Respiratory failure / ETT 1/29 Tension pneumothorax / R chest tube placed  CULTURES: 1/29  Sputum >>> 1/28  Blood >>> 1/28  Urine >>>  ANTIBIOTICS:  SUBJECTIVE: CXR improving on 30cm H2O, comfortable, PSV for 1hr 20 min this morning  PHYSICAL EXAMINATION:  Vital signs reviewed   General: comfortable on vent HEENT: Trach in place, c/d/i PULM: CTA B CV: n1 S1/S2 AB: BS+, soft Ext: edema noted throughout  Neuro: nods head to questions appropriately   Recent Labs Lab 08/16/13 0500 08/16/13 1253 08/18/13 0510 08/18/13 0540 08/19/13 0500 08/20/13 0820 08/22/13 0600  HGB 7.6*  --   --  6.5* 7.1* 9.0* 9.2*  WBC 13.3*  --   --  10.0 9.5 11.5* 20.5*  PLT 390  --   --  348 257 229 233  NA 150*  --   --  148* 146 141  --   K 3.7  --   --  4.5 4.6 4.4  --   CL 114*  --   --  112 110 104  --   CO2 27  --   --  27 27 29   --   GLUCOSE 182*  --   --  233* 211* 201*  --   BUN 28*  --   --  29* 27* 22  --   CREATININE 1.00  --   --  0.90 0.81 0.64  --   CALCIUM 8.0*  --   --  7.6* 7.3* 7.2*  --   INR  --   --   --   --   --  1.36  --   APTT  --   --   --   --   --  51*  --   PHART  --  7.294* 7.422  --   --   --   --   PCO2ART  --  57.5* 44.9  --   --   --   --   PO2ART  --  124.0* 67.5*  --   --   --   --   HCO3  --  26.9* 28.8*  --   --   --   --   O2SAT  --  98.5 94.3  --   --   --   --    CXR:  1/29 >>> Hardware in good position, resolution of tension  pneumothorax, bibasilar airspace disease   ASSESSMENT:  Acute respiratory failure Pneumonia ( Pseudomonas, MRSA) COPD, no evidence of exacerbation CHF, with possible exacerbation Tension pneumothorax R> improved on 30cm suction Neuro-muscular weakness ( GBS vs post CVA vs ICU myopathy / neuropathy )  PLAN: - continue weaning per protocol - Maintain suction to 30 on CT through weekend, likely water seal 2/9 - Daily CXR then repeat CXR for PTX. - Minimize sedation as able. - Bronchodilators. - Steroids are not indicated. - Antibiotics / diuresis per primary team.  Max FickleMCQUAID, DOUGLAS [facility] PCCM Pager: [phone] Cell: [phone] If no  response, call [phone]

## 2013-08-22 NOTE — Progress Notes (Signed)
Patient seen, examined, and I agree with the above documentation, including the assessment and plan. CT abd pelvis reviewed, regarding possible PEG there is a small amount of perihepatic ascites which would likely not impede PEG placement. There is however, a 5 cm, ventral hernia, which may pose problematic regarding PEG placement. Coags also not normal making bleeding post-PEG a possibility  Once coags improve to very near normal (PT and PTT), then PEG can be considered EGD would be done 1st to ensure transillumination from the gastric wall to the skin.  If transillumination is not possible due to habitus or other reason, then GI placed PEG will not be possible Will re-evaluate pt on Monday

## 2013-08-22 NOTE — Progress Notes (Signed)
[facility] Progress Note  Subjective:  Awaiting CT scan.  No acute issues O/N, but labs this AM show a new leukocytosis of 20.5K.  Objective:  Vital signs in last 24 hours:   98.6-61-23-107/49-99% General:  Chronically ill-appearing; comfortable.  Has trach. Heart:  Regular rate and rhythm; no murmurs Pulm:  Clear; mechanical ventilation sounds noted. Abdomen:  Soft, non-distended. Normal bowel sounds.  Non-tender.  Extremities:  Without edema in lower extremities, but non-pitting edema in arms and hands. Neurologic:  Alert and  oriented x4;  grossly normal neurologically. Psych:  Alert.  Nods head and blinks eyes appropriately to respond to questions.  Lab Results:  Recent Labs  08/20/13 0820 08/22/13 0600  WBC 11.5* 20.5*  HGB 9.0* 9.2*  HCT 27.1* 28.2*  PLT 229 233   BMET  Recent Labs  08/20/13 0820 08/22/13 0600  NA 141 136*  K 4.4 4.4  CL 104 96  CO2 29 27  GLUCOSE 201* 74  BUN 22 14  CREATININE 0.64 0.61  CALCIUM 7.2* 7.4*   PT/INR  Recent Labs  08/20/13 0820 08/22/13 0600  LABPROT 16.4* 17.9*  INR 1.36 1.52*   Dg Chest Port 1 View  08/21/2013   CLINICAL DATA:  Respiratory failure  EXAM: PORTABLE CHEST - 1 VIEW  COMPARISON:  08/20/2013  FINDINGS: Postsurgical changes are noted with chest tube in position on the right. Right basilar pneumothorax is again identified. Right basilar infiltrative changes are again seen similar to that noted on the prior exam. Some left basilar atelectasis is noted as well. Tracheostomy catheter and left-sided jugular line are stable.  IMPRESSION: No change from the prior exam.   Electronically Signed   By: Alcide Clever M.D.   On: 08/21/2013 16:00   Dg Chest Port 1 View  08/20/2013   CLINICAL DATA:  COPD.  EXAM: PORTABLE CHEST - 1 VIEW  COMPARISON:  DG CHEST 1V PORT dated 08/20/2013  FINDINGS: Tracheostomy tube noted in good anatomic position. Left IJ line and right chest tube in stable position. NG tube removed. Right  base pneumothorax is again noted. This appears less prominent on today's exam. A separate chest tube over the right base may be present. The lower chest is incompletely imaged. Dense atelectasis and/or infiltrates present in the right lower lobe. There is cardiomegaly with bilateral pulmonary alveolar infiltrates and pulmonary venous congestion suggesting congestive heart failure. Small left pleural effusion may be present.  IMPRESSION: 1. Persistent right base pneumothorax, slightly smaller than prior exam. Right upper chest tube noted in stable position. It cannot be determined if a second chest tube is present over the right lower chest. The lower portions of the chest not completely imaged. 2. Dense right base atelectasis. 3. Findings suggesting congestive heart failure with bilateral pulmonary edema and pleural effusions. Superimposed pneumonia may be present . 4. Tracheostomy tube and central line in stable position. NG tube has been removed.   Electronically Signed   By: Maisie Fus  Register   On: 08/20/2013 16:18   Dg Chest Port 1 View  08/20/2013   CLINICAL DATA:  Post tracheotomy  EXAM: PORTABLE CHEST - 1 VIEW  COMPARISON:  Portable exam 1200 hr compared to 08/16/2013  FINDINGS: New tracheostomy tube with tip projecting 4.6 cm above carina.  Left jugular central venous catheter tip projecting over SVC.  Nasogastric tube extends only to the distal esophagus, recommend advancing tube 15 cm to place proximal side-port at the stomach.  Right thoracostomy tube present.  Stable heart size.  Pulmonary vascular congestion.  Extensive bibasilar opacities again identified.  New pneumothorax at right base with significant atelectasis of the lower right lung.  No mediastinal shift/tension.  Bones unremarkable.  IMPRESSION: New large right basilar pneumothorax despite right thoracostomy tube with significant atelectasis at lower right lung but no evidence of tension pneumothorax/mediastinal shift.  Persistent bibasilar  infiltrates  Tip of nasogastric tube projects over the distal esophagus, recommend advancing tube 15 cm to place proximal slight poor within the stomach.  Critical Value/emergent results were called by telephone at the time of interpretation on 08/20/2013 at 12:19 PM to Dr. Sharyon MedicusHijazi, who verbally acknowledged these results.   Electronically Signed   By: Ulyses SouthwardMark  Boles M.D.   On: 08/20/2013 12:21   Dg Abd Portable 1v  08/21/2013   CLINICAL DATA:  NG tube placement  EXAM: PORTABLE ABDOMEN - 1 VIEW  COMPARISON:  DG ABD PORTABLE 1V dated 08/21/2013  FINDINGS: NG tube is placed with tip in the gastric fundus in good position. Bilateral pleural effusions and basilar atelectasis.  IMPRESSION: NG tube with tip in the gastric fundus.   Electronically Signed   By: Genevive BiStewart  Edmunds M.D.   On: 08/21/2013 21:58   Dg Abd Portable 1v  08/21/2013   CLINICAL DATA:  NG tube  placement  EXAM: PORTABLE ABDOMEN - 1 VIEW  COMPARISON:  DG ABD PORTABLE 1V dated 08/13/2013  FINDINGS: NG tube is not evident on the abdominal radiograph. Recommend replacement.  IMPRESSION: NG tube is not evident on the abdominal radiograph.  Findings conveyed toMitzzi , RNon 08/21/2013  at20:15.   Electronically Signed   By: Genevive BiStewart  Edmunds M.D.   On: 08/21/2013 20:18    Assessment / Plan: *Dysphagia:  Questioning possibility of PEG.  *Transfusion requiring anemia. 4 units since admission 1/28.  *Hem + NG aspirate, EGD 07/2013 with esophagitis, gastritis, HH per [facility].  *Vent dependent resp failure post HCAP/MRSA/pseudomonas: S/p Trach. Chest tube for PTX.  *Coagulopathy despite dose of Vitamin K. No mention of liver disease, chart mentions hx of ETOH abuse before 2011, no mention of portal gastropathy or varices in [place].  *Umbilical hernia, reducible  *Candidal rash in groin  *Lewy body dementia, paranoid schizophrenia *New leukocytosis of 20.5K seen on today's labs.  -Await results of CT scan to rule out abdominal  varices prior to making decision regarding PEG placement.    LOS: 9 days   Elwood Bazinet D.  08/22/2013, 11:06 AM  Pager number [phone]

## 2013-08-24 LAB — CBC
HCT: 24.9 % — ABNORMAL LOW (ref 39.0–52.0)
Hemoglobin: 8.2 g/dL — ABNORMAL LOW (ref 13.0–17.0)
MCH: 28.4 pg (ref 26.0–34.0)
MCHC: 32.9 g/dL (ref 30.0–36.0)
MCV: 86.2 fL (ref 78.0–100.0)
PLATELETS: 230 10*3/uL (ref 150–400)
RBC: 2.89 MIL/uL — AB (ref 4.22–5.81)
RDW: 18.1 % — AB (ref 11.5–15.5)
WBC: 16.6 10*3/uL — AB (ref 4.0–10.5)

## 2013-08-24 LAB — BASIC METABOLIC PANEL
BUN: 11 mg/dL (ref 6–23)
CALCIUM: 7.5 mg/dL — AB (ref 8.4–10.5)
CHLORIDE: 101 meq/L (ref 96–112)
CO2: 25 mEq/L (ref 19–32)
Creatinine, Ser: 0.58 mg/dL (ref 0.50–1.35)
GFR calc non Af Amer: >90 mL/min (ref >90)
Glucose, Bld: 87 mg/dL (ref 70–99)
Potassium: 4.1 mEq/L (ref 3.7–5.3)
Sodium: 139 mEq/L (ref 137–147)

## 2013-08-25 ENCOUNTER — Other Ambulatory Visit (HOSPITAL_COMMUNITY): Payer: Medicaid Other

## 2013-08-25 DIAGNOSIS — R4182 Altered mental status, unspecified: Secondary | ICD-10-CM | POA: Diagnosis not present

## 2013-08-25 DIAGNOSIS — R6339 Other feeding difficulties: Secondary | ICD-10-CM | POA: Diagnosis present

## 2013-08-25 DIAGNOSIS — R1310 Dysphagia, unspecified: Secondary | ICD-10-CM | POA: Diagnosis present

## 2013-08-25 DIAGNOSIS — R633 Feeding difficulties: Secondary | ICD-10-CM | POA: Diagnosis present

## 2013-08-25 DIAGNOSIS — I6350 Cerebral infarction due to unspecified occlusion or stenosis of unspecified cerebral artery: Secondary | ICD-10-CM | POA: Diagnosis not present

## 2013-08-25 DIAGNOSIS — J9600 Acute respiratory failure, unspecified whether with hypoxia or hypercapnia: Secondary | ICD-10-CM | POA: Diagnosis not present

## 2013-08-25 MED ORDER — IOHEXOL 300 MG/ML  SOLN
80.0000 mL | Freq: Once | INTRAMUSCULAR | Status: AC | PRN
  Administered 2013-08-25: 80 mL via INTRAVENOUS

## 2013-08-25 NOTE — Progress Notes (Signed)
PULMONARY / CRITICAL CARE MEDICINE  Name: Ralph Rowe MRN: [id number] DOB: 1951/02/05    ADMISSION DATE:  08/13/2013 CONSULTATION DATE:  08/14/2013  BRIEF PATIENT DESCRIPTION: 63 yo male with COPD, CHF, HTN initially admitted to [facility] with pseudomonas, MRSA, PNA requiring intubation.  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  Unable to wean, transferred to [facility] for further vent weaning and rehab.   SIGNIFICANT EVENTS / STUDIES:  1/19  Respiratory failure / ETT 1/29 Tension pneumothorax / R chest tube placed  CULTURES: Per ssh  ANTIBIOTICS:  SUBJECTIVE: Rt ct clotted off.  PHYSICAL EXAMINATION:  Vital signs reviewed. Abnormal values will appear under impression plan section.    General: comfortable on vent HEENT: Trach in place, c/d/i PULM: decreased bs bases. Rt CT no air leak and no drainage from tube but leaking around tube.  CV: n1 S1/S2 AB: BS+, soft Ext: edema noted throughout  Neuro:Lethargic   Recent Labs Lab 08/19/13 0500 08/20/13 0820 08/22/13 0600 08/24/13 0500  HGB 7.1* 9.0* 9.2* 8.2*  WBC 9.5 11.5* 20.5* 16.6*  PLT 257 229 233 230  NA 146 141 136* 139  K 4.6 4.4 4.4 4.1  CL 110 104 96 101  CO2 27 29 27 25   GLUCOSE 211* 201* 74 87  BUN 27* 22 14 11   CREATININE 0.81 0.64 0.61 0.58  CALCIUM 7.3* 7.2* 7.4* 7.5*  INR  --  1.36 1.52*  --   APTT  --  51* 74*  --    Dg Chest Port 1 View  08/25/2013   CLINICAL DATA:  Pneumothorax, chest tube  EXAM: PORTABLE CHEST - 1 VIEW  COMPARISON:  DG CHEST 1V PORT dated 08/21/2013; DG CHEST 1V PORT dated 08/20/2013; DG CHEST 1V PORT dated 08/20/2013; CT CHEST W/O CM dated 08/15/2013  FINDINGS: There is a right-sided chest tube directed towards the apex. There is a right basilar pneumothorax. There is right mid lung opacity which is unchanged from the prior exam. There is bilateral interstitial and alveolar airspace opacities. There is a small left  pleural effusion. The tracheostomy tube is in unchanged position. There is a nasogastric tube and a left-sided subclavian central venous catheter in unchanged positions. Stable cardiomediastinal silhouette.  IMPRESSION: Right-sided chest tube directed towards the apex with a right basilar pneumothorax.  Bilateral interstitial and alveolar airspace opacities unchanged in the prior exam. This may reflect multi lobar pneumonia versus pulmonary edema.   Electronically Signed   By: Elige Ko   On: 08/25/2013 12:25    ASSESSMENT:  Acute respiratory failure Pneumonia ( Pseudomonas, MRSA) COPD, no evidence of exacerbation CHF, with possible exacerbation Tension pneumothorax R> improved on 30cm suction, now clotted of 2/9 Neuro-muscular weakness ( GBS vs post CVA vs ICU myopathy / neuropathy ) Lung necrosis, PNA  PLAN: -malfunctioning CT now, CT done to assess, needs to be TPA or replaced. Attempt tpa first - Daily CXR then repeat CXR for PTX this afternoon - I am unsure if this lung is trap and will come up with or without CT, it has improved aeration to some extent with now infiltrate and necrosis -clinically appears well currently with current Chest tube malfxn - Minimize sedation as able. - Bronchodilators. - Steroids are not indicated. - Antibiotics / diuresis per primary team. - CT prelim appears much better as far as PTX, still with necrosis, effusion smaller -will discuss current ABX regimen, consider clinda  Brett Canales Minor  ACNP [facility] P[facility]M Pager [phone] till 3 pm If no answer page [phone] 08/25/2013, 1:44 PM  I have fully examined this patient and agree with above findings.    And edited in full  Mcarthur RossettiDaniel J. Tyson AliasFeinstein, MD, FACP Pgr: [phone] [facility]'

## 2013-08-25 NOTE — Progress Notes (Signed)
[facility] Progress Note  Subjective:  Going for CT of chest shortly.  Failed weaning today.  Objective:  Vital signs in last 24 hours:  98.9-72-16-107/60-100% General:  Alert, chronically ill-appearing, in NAD; has trach Heart:  Regular rate and rhythm; no murmurs Pulm:  Clear. Abdomen:  Soft, non-distended. Normal bowel sounds.  Non-tender.  Reducible ventral hernia noted. Extremities:  Without edema in lower extremities.  Non-pitting edema noted in arms and hands. Neurologic:  Alert; nods head and blinks eyes to questions.  Lab Results:  Recent Labs  08/24/13 0500  WBC 16.6*  HGB 8.2*  HCT 24.9*  PLT 230   BMET  Recent Labs  08/24/13 0500  NA 139  K 4.1  CL 101  CO2 25  GLUCOSE 87  BUN 11  CREATININE 0.58  CALCIUM 7.5*   Assessment / Plan: *Dysphagia: Questioning possibility of PEG.  CT shows 5 cm ventral hernia.  Dr. Leone PayorGessner would like to review CT images with radiology.  *Transfusion requiring anemia. 4 units since admission 1/28.  Hgb down slightly (1 gram) this AM.  *Hem + NG aspirate earlier in admission, EGD 07/2013 with esophagitis, gastritis, HH [facility]  *Vent dependent resp failure post HCAP/MRSA/pseudomonas: S/p Trach. Chest tube for PTX.  Have CT chest today.  *Coagulopathy despite dose of Vitamin K. No cirrhosis or abdominal varices seen on CT scan.  No PTT or PT/INR ordered since 2/6.  Will recheck those today since they will need to be corrected prior to any PEG placement.  *Umbilical hernia, reducible  *Candidal rash in groin  *Lewy body dementia, paranoid schizophrenia   LOS: 12 days   ZEHR, JESSICA D.  08/25/2013, 9:12 AM  Pager number [phone]   [facility] Attending  I have assessed the patient and agree with the above note. Need to recheck coags. Reviewed CT with radiologist and I think anatomic situation is such that PEG can be attempted - cannot guarantee it will be successful - but must have coags  normal first.  Iva Booparl E. Emely Fahy, MD, [facility] [phone] (pager) 08/25/2013 2:32 PM

## 2013-08-26 ENCOUNTER — Other Ambulatory Visit (HOSPITAL_COMMUNITY): Payer: Medicaid Other

## 2013-08-26 DIAGNOSIS — R1310 Dysphagia, unspecified: Secondary | ICD-10-CM | POA: Diagnosis not present

## 2013-08-26 DIAGNOSIS — R633 Feeding difficulties, unspecified: Secondary | ICD-10-CM | POA: Diagnosis not present

## 2013-08-26 LAB — PROTIME-INR
INR: 1.69 — ABNORMAL HIGH (ref 0.00–1.49)
Prothrombin Time: 19.4 seconds — ABNORMAL HIGH (ref 11.6–15.2)

## 2013-08-26 LAB — APTT: aPTT: 55 seconds — ABNORMAL HIGH (ref 24–37)

## 2013-08-26 MED ORDER — VITAMIN K1 10 MG/ML IJ SOLN: 5.0000 mg | Freq: Once | INTRAMUSCULAR | Status: DC

## 2013-08-26 NOTE — Progress Notes (Signed)
PULMONARY / CRITICAL CARE MEDICINE  Name: Ralph Rowe MRN: [id number] DOB: 11-Jan-1951    ADMISSION DATE:  08/13/2013 CONSULTATION DATE:  08/14/2013  BRIEF PATIENT DESCRIPTION: 63 yo male with COPD, CHF, HTN initially admitted to [facility] with pseudomonas, MRSA, PNA requiring intubation.  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  Unable to wean, transferred to [facility] for further vent weaning and rehab.   SIGNIFICANT EVENTS / STUDIES:  1/19  Respiratory failure / ETT 1/29 Tension pneumothorax / R chest tube placed 2/9 ct chest- improved ptx, effusion, necrotic pna, tpa failure in ct 2/10- no hemodynamic changes with chest tube clotted  CULTURES: Per ssh  ANTIBIOTICS:  SUBJECTIVE: Rt ct clotted off.  PHYSICAL EXAMINATION:  Vital signs reviewed. Abnormal values will appear under impression plan section.   General: comfortable on vent HEENT: Trach in place PULM: ronchi rt greater left CV: n1 S1/S2 AB: BS+, soft Ext: edema noted throughout  Neuro: rass -3   Recent Labs Lab 08/20/13 0820 08/22/13 0600 08/24/13 0500 08/26/13 0045  HGB 9.0* 9.2* 8.2*  --   WBC 11.5* 20.5* 16.6*  --   PLT 229 233 230  --   NA 141 136* 139  --   K 4.4 4.4 4.1  --   CL 104 96 101  --   CO2 29 27 25   --   GLUCOSE 201* 74 87  --   BUN 22 14 11   --   CREATININE 0.64 0.61 0.58  --   CALCIUM 7.2* 7.4* 7.5*  --   INR 1.36 1.52*  --  1.69*  APTT 51* 74*  --  55*   Ct Chest W Contrast  08/25/2013   CLINICAL DATA:  Right chest tube. Pneumothorax, respiratory failure.  EXAM: CT CHEST WITH CONTRAST  TECHNIQUE: Multidetector CT imaging of the chest was performed during intravenous contrast administration.  CONTRAST:  80mL OMNIPAQUE IOHEXOL 300 MG/ML  SOLN  COMPARISON:  CT CHEST W/O CM dated 08/15/2013; DG CHEST 1V PORT dated 08/13/2013  FINDINGS: Tracheostomy in place. There are numerous lymph nodes in the supraclavicular  regions which are not enlarged by CT size criteria. Mediastinal lymph nodes are generally sub cm in short axis size. AP window lymph node measures 12 mm. Right hilum is difficult to definitively evaluate given surrounding right lung consolidate. No left hilar or axillary adenopathy. Heart size normal. No pericardial effusion. Incidental note is made of bilateral gynecomastia.  Small bilateral effusions, partially loculated on the right. Right chest tube terminates at the apex of the right hemi thorax and appears debris-filled throughout. Extensive consolidation throughout the right lung, with areas of lucency, progressive from 08/15/2013. Septal thickening and ground-glass airspace disease in the right upper lobe. Small right pneumothorax, decreased from prior. Right mainstem bronchus is occluded (image 25), which is progressive from the prior exam. On the left, peribronchovascular ground-glass and areas of consolidation appear mildly progressive in the left upper and left lower lobes. No definite necrosis.  Incidental imaging of the upper abdomen shows the margin of the left hepatic lobe to be mildly irregular. Visualized portions of the liver, adrenal glands, left kidney, spleen, pancreas, stomach and bowel are otherwise grossly unremarkable. No upper abdominal adenopathy. Difficult to exclude perihepatic ascites. No worrisome lytic or sclerotic lesions. Degenerative changes are seen in the spine. Lower thoracic compression fracture, as before. Diffuse body wall edema.  IMPRESSION: 1. Small loculated right hydro pneumothorax with debris  filling the length of the chest tube. Amount of pleural air has decreased in the interval. 2. Progressive consolidation and airspace disease in the right lung with areas of parenchymal lucency, most consistent with pneumonia and necrosis. Followup to clearing is recommended as a centrally obstructing mass cannot be excluded. 3. Slight progression in airspace disease in the left  upper and left lower lobes. 4. Small left pleural effusion without loculation. 5. Numerous subcentimeter supraclavicular and mediastinal lymph nodes, likely reactive. 6. Marginal irregularity of the liver, indicative of cirrhosis.   Electronically Signed   By: Leanna BattlesMelinda  Blietz M.D.   On: 08/25/2013 15:31   Dg Chest Port 1 View  08/26/2013   CLINICAL DATA:  Pneumothorax, chest pain  EXAM: PORTABLE CHEST - 1 VIEW  COMPARISON:  CT CHEST W/CM dated 08/25/2013; DG CHEST 1V PORT dated 08/21/2013  FINDINGS: Tracheostomy tube is appropriately positioned. Left IJ approach central line terminates over the distal SVC. Right-sided chest tube is in place with tip at the right lung apex. Right lung base incompletely included in the field of view, but no significant change in small loculated right basilar hydropneumothorax. No mediastinal shift. Nasogastric tube tip terminates below the level of the hemidiaphragms but is not included in the field of view. Bibasilar airspace opacities persist. Trace left pleural effusion again noted. The overall degree of aeration is slightly improved.  IMPRESSION: Slightly improved degree of aeration.   Electronically Signed   By: Christiana PellantGretchen  Green M.D.   On: 08/26/2013 07:55   Dg Chest Port 1 View  08/25/2013   CLINICAL DATA:  Pneumothorax, chest tube  EXAM: PORTABLE CHEST - 1 VIEW  COMPARISON:  DG CHEST 1V PORT dated 08/21/2013; DG CHEST 1V PORT dated 08/20/2013; DG CHEST 1V PORT dated 08/20/2013; CT CHEST W/O CM dated 08/15/2013  FINDINGS: There is a right-sided chest tube directed towards the apex. There is a right basilar pneumothorax. There is right mid lung opacity which is unchanged from the prior exam. There is bilateral interstitial and alveolar airspace opacities. There is a small left pleural effusion. The tracheostomy tube is in unchanged position. There is a nasogastric tube and a left-sided subclavian central venous catheter in unchanged positions. Stable cardiomediastinal silhouette.   IMPRESSION: Right-sided chest tube directed towards the apex with a right basilar pneumothorax.  Bilateral interstitial and alveolar airspace opacities unchanged in the prior exam. This may reflect multi lobar pneumonia versus pulmonary edema.   Electronically Signed   By: Elige KoHetal  Patel   On: 08/25/2013 12:25    ASSESSMENT:  Acute respiratory failure Pneumonia ( Pseudomonas, MRSA) COPD, no evidence of exacerbation CHF, with possible exacerbation Tension pneumothorax R> improved on 30cm suction, now clotted of 2/9 Neuro-muscular weakness ( GBS vs post CVA vs ICU myopathy / neuropathy ) Lung necrosis, PNA  PLAN: -malfunctioning CT, attempted tpa, ptx, effusion better, left with lung necrosis, hemodynamically better with CT not functioning , will dc ct, repeat pxr in 2 hr then in am  - CT reviewed with radiology, improved overall -will discuss current ABX regimen,with necrosis , ensure polymicrobial and anaerobic empiric -attempt PS 22  Daniel J. Tyson AliasFeinstein, MD, FACP Pgr: [phone] [facility]'

## 2013-08-26 NOTE — Progress Notes (Signed)
[facility] Progress Note  Subjective: Having chest tube taken out today.  CT chest shows cirrhosis.  Objective:  Vital signs in last 24 hours:  98.4-61-21-140/68-100% General:  Alert, chronically ill-appearing, in NAD; on vent with trach Heart:  Regular rate and rhythm; no murmurs Pulm:  Course lung sounds noted.   Abdomen:  Soft, non-distended. Normal bowel sounds.  Non-tender.  Reducible ventral hernia noted.   Extremities:  Edema noted in UE's Neurologic:  Alert; blinks eyes and nods head in response to questions.  Lab Results:  Recent Labs  08/24/13 0500  WBC 16.6*  HGB 8.2*  HCT 24.9*  PLT 230   BMET  Recent Labs  08/24/13 0500  NA 139  K 4.1  CL 101  CO2 25  GLUCOSE 87  BUN 11  CREATININE 0.58  CALCIUM 7.5*   PT/INR  Recent Labs  08/26/13 0045  LABPROT 19.4*  INR 1.69*   Ct Chest W Contrast  08/25/2013   CLINICAL DATA:  Right chest tube. Pneumothorax, respiratory failure.  EXAM: CT CHEST WITH CONTRAST  TECHNIQUE: Multidetector CT imaging of the chest was performed during intravenous contrast administration.  CONTRAST:  80mL OMNIPAQUE IOHEXOL 300 MG/ML  SOLN  COMPARISON:  CT CHEST W/O CM dated 08/15/2013; DG CHEST 1V PORT dated 08/13/2013  FINDINGS: Tracheostomy in place. There are numerous lymph nodes in the supraclavicular regions which are not enlarged by CT size criteria. Mediastinal lymph nodes are generally sub cm in short axis size. AP window lymph node measures 12 mm. Right hilum is difficult to definitively evaluate given surrounding right lung consolidate. No left hilar or axillary adenopathy. Heart size normal. No pericardial effusion. Incidental note is made of bilateral gynecomastia.  Small bilateral effusions, partially loculated on the right. Right chest tube terminates at the apex of the right hemi thorax and appears debris-filled throughout. Extensive consolidation throughout the right lung, with areas of lucency, progressive from  08/15/2013. Septal thickening and ground-glass airspace disease in the right upper lobe. Small right pneumothorax, decreased from prior. Right mainstem bronchus is occluded (image 25), which is progressive from the prior exam. On the left, peribronchovascular ground-glass and areas of consolidation appear mildly progressive in the left upper and left lower lobes. No definite necrosis.  Incidental imaging of the upper abdomen shows the margin of the left hepatic lobe to be mildly irregular. Visualized portions of the liver, adrenal glands, left kidney, spleen, pancreas, stomach and bowel are otherwise grossly unremarkable. No upper abdominal adenopathy. Difficult to exclude perihepatic ascites. No worrisome lytic or sclerotic lesions. Degenerative changes are seen in the spine. Lower thoracic compression fracture, as before. Diffuse body wall edema.  IMPRESSION: 1. Small loculated right hydro pneumothorax with debris filling the length of the chest tube. Amount of pleural air has decreased in the interval. 2. Progressive consolidation and airspace disease in the right lung with areas of parenchymal lucency, most consistent with pneumonia and necrosis. Followup to clearing is recommended as a centrally obstructing mass cannot be excluded. 3. Slight progression in airspace disease in the left upper and left lower lobes. 4. Small left pleural effusion without loculation. 5. Numerous subcentimeter supraclavicular and mediastinal lymph nodes, likely reactive. 6. Marginal irregularity of the liver, indicative of cirrhosis.   Electronically Signed   By: Leanna BattlesMelinda  Blietz M.D.   On: 08/25/2013 15:31   Dg Chest Port 1 View  08/26/2013   CLINICAL DATA:  Pneumothorax, chest pain  EXAM: PORTABLE CHEST - 1 VIEW  COMPARISON:  CT CHEST  W/CM dated 08/25/2013; DG CHEST 1V PORT dated 08/21/2013  FINDINGS: Tracheostomy tube is appropriately positioned. Left IJ approach central line terminates over the distal SVC. Right-sided chest tube is  in place with tip at the right lung apex. Right lung base incompletely included in the field of view, but no significant change in small loculated right basilar hydropneumothorax. No mediastinal shift. Nasogastric tube tip terminates below the level of the hemidiaphragms but is not included in the field of view. Bibasilar airspace opacities persist. Trace left pleural effusion again noted. The overall degree of aeration is slightly improved.  IMPRESSION: Slightly improved degree of aeration.   Electronically Signed   By: Christiana Pellant M.D.   On: 08/26/2013 07:55   Dg Chest Port 1 View  08/25/2013   CLINICAL DATA:  Pneumothorax, chest tube  EXAM: PORTABLE CHEST - 1 VIEW  COMPARISON:  DG CHEST 1V PORT dated 08/21/2013; DG CHEST 1V PORT dated 08/20/2013; DG CHEST 1V PORT dated 08/20/2013; CT CHEST W/O CM dated 08/15/2013  FINDINGS: There is a right-sided chest tube directed towards the apex. There is a right basilar pneumothorax. There is right mid lung opacity which is unchanged from the prior exam. There is bilateral interstitial and alveolar airspace opacities. There is a small left pleural effusion. The tracheostomy tube is in unchanged position. There is a nasogastric tube and a left-sided subclavian central venous catheter in unchanged positions. Stable cardiomediastinal silhouette.  IMPRESSION: Right-sided chest tube directed towards the apex with a right basilar pneumothorax.  Bilateral interstitial and alveolar airspace opacities unchanged in the prior exam. This may reflect multi lobar pneumonia versus pulmonary edema.   Electronically Signed   By: Elige Ko   On: 08/25/2013 12:25    Assessment / Plan: *Dysphagia: Questioning possibility of PEG.  Dr. Leone Payor reviewed CT scan and thinks that PEG could be attempted.   *Transfusion requiring anemia. 4 units since admission 1/28. *Hem + NG aspirate earlier in admission, EGD 07/2013 with esophagitis, gastritis, HH per one of the [facility].  *Vent  dependent resp failure post HCAP/MRSA/pseudomonas: S/p Trach. Chest tube for PTX.  Chest tube being removed today.  *Coagulopathy.  No cirrhosis or abdominal varices seen on CT scan of the abdomen, but CT of the chest is saying irregular liver C/W cirrhosis, which would explain his coagulopathy.  PTT or PT/INR still elevated; need to be corrected.  Will give dose of Vitamin K 5 mg SQ today and recheck in AM.  Timing of PEG will be discussed with Dr. Leone Payor.  Continue tube feeds via NGT in the interim. *Ventral hernia, reducible  *Candidal rash in groin  *Lewy body dementia, paranoid schizophrenia    LOS: 13 days   ZEHR, JESSICA D.  08/26/2013, 9:22 AM  Pager number [phone]  [facility] Attending  I have also seen and assessed the patient and agree with the above note. Attempting to improve coags with vit K.   Iva Boop, MD, [facility] [phone] (pager) 08/26/2013 4:44 PM

## 2013-08-27 ENCOUNTER — Other Ambulatory Visit (HOSPITAL_COMMUNITY): Payer: Medicaid Other

## 2013-08-27 LAB — CBC
HCT: 28.2 % — ABNORMAL LOW (ref 39.0–52.0)
Hemoglobin: 9 g/dL — ABNORMAL LOW (ref 13.0–17.0)
MCH: 27.8 pg (ref 26.0–34.0)
MCHC: 31.9 g/dL (ref 30.0–36.0)
MCV: 87 fL (ref 78.0–100.0)
Platelets: 316 10*3/uL (ref 150–400)
RBC: 3.24 MIL/uL — AB (ref 4.22–5.81)
RDW: 18.3 % — ABNORMAL HIGH (ref 11.5–15.5)
WBC: 20.4 10*3/uL — ABNORMAL HIGH (ref 4.0–10.5)

## 2013-08-27 LAB — BASIC METABOLIC PANEL
BUN: 8 mg/dL (ref 6–23)
CO2: 24 meq/L (ref 19–32)
CREATININE: 0.45 mg/dL — AB (ref 0.50–1.35)
Calcium: 7.7 mg/dL — ABNORMAL LOW (ref 8.4–10.5)
Chloride: 101 mEq/L (ref 96–112)
GFR calc Af Amer: >90 mL/min (ref >90)
GFR calc non Af Amer: >90 mL/min (ref >90)
Glucose, Bld: 163 mg/dL — ABNORMAL HIGH (ref 70–99)
Potassium: 4.2 mEq/L (ref 3.7–5.3)
Sodium: 138 mEq/L (ref 137–147)

## 2013-08-27 LAB — APTT: aPTT: 55 seconds — ABNORMAL HIGH (ref 24–37)

## 2013-08-27 LAB — DIGOXIN LEVEL: Digoxin Level: 0.8 ng/mL (ref 0.8–2.0)

## 2013-08-27 LAB — PROCALCITONIN: Procalcitonin: 0.32 ng/mL

## 2013-08-27 LAB — PROTIME-INR
INR: 1.52 — ABNORMAL HIGH (ref 0.00–1.49)
Prothrombin Time: 17.9 seconds — ABNORMAL HIGH (ref 11.6–15.2)

## 2013-08-27 NOTE — Progress Notes (Signed)
PULMONARY / CRITICAL CARE MEDICINE  Name: Linwood Gullikson MRN: [id number] DOB: 1951-07-02    ADMISSION DATE:  08/13/2013 CONSULTATION DATE:  08/14/2013  BRIEF PATIENT DESCRIPTION: 63 yo male with COPD, CHF, HTN initially admitted to [facility] with pseudomonas, MRSA, PNA requiring intubation.  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  Unable to wean, transferred to [facility] for further vent weaning and rehab.   SIGNIFICANT EVENTS / STUDIES:  1/19  Respiratory failure / ETT 1/29 Tension pneumothorax / R chest tube placed 2/9 ct chest- improved ptx, effusion, necrotic pna, tpa failure in ct 2/10- no hemodynamic changes with chest tube clotted  CULTURES: Per ssh  ANTIBIOTICS:  SUBJECTIVE: NAD  PHYSICAL EXAMINATION:  Vital signs reviewed. Abnormal values will appear under impression plan section.   General: comfortable on vent HEENT: Trach in place PULM: ronchi rt greater left CV: n1 S1/S2 AB: BS+, soft Ext: edema noted throughout  Neuro: rass -3   Recent Labs Lab 08/22/13 0600 08/24/13 0500 08/26/13 0045 08/27/13 0632  HGB 9.2* 8.2*  --  9.0*  WBC 20.5* 16.6*  --  20.4*  PLT 233 230  --  316  NA 136* 139  --  138  K 4.4 4.1  --  4.2  CL 96 101  --  101  CO2 27 25  --  24  GLUCOSE 74 87  --  163*  BUN 14 11  --  8  CREATININE 0.61 0.58  --  0.45*  CALCIUM 7.4* 7.5*  --  7.7*  INR 1.52*  --  1.69* 1.52*  APTT 74*  --  55* 55*   Ct Chest W Contrast  08/25/2013   CLINICAL DATA:  Right chest tube. Pneumothorax, respiratory failure.  EXAM: CT CHEST WITH CONTRAST  TECHNIQUE: Multidetector CT imaging of the chest was performed during intravenous contrast administration.  CONTRAST:  80mL OMNIPAQUE IOHEXOL 300 MG/ML  SOLN  COMPARISON:  CT CHEST W/O CM dated 08/15/2013; DG CHEST 1V PORT dated 08/13/2013  FINDINGS: Tracheostomy in place. There are numerous lymph nodes in the supraclavicular regions which are  not enlarged by CT size criteria. Mediastinal lymph nodes are generally sub cm in short axis size. AP window lymph node measures 12 mm. Right hilum is difficult to definitively evaluate given surrounding right lung consolidate. No left hilar or axillary adenopathy. Heart size normal. No pericardial effusion. Incidental note is made of bilateral gynecomastia.  Small bilateral effusions, partially loculated on the right. Right chest tube terminates at the apex of the right hemi thorax and appears debris-filled throughout. Extensive consolidation throughout the right lung, with areas of lucency, progressive from 08/15/2013. Septal thickening and ground-glass airspace disease in the right upper lobe. Small right pneumothorax, decreased from prior. Right mainstem bronchus is occluded (image 25), which is progressive from the prior exam. On the left, peribronchovascular ground-glass and areas of consolidation appear mildly progressive in the left upper and left lower lobes. No definite necrosis.  Incidental imaging of the upper abdomen shows the margin of the left hepatic lobe to be mildly irregular. Visualized portions of the liver, adrenal glands, left kidney, spleen, pancreas, stomach and bowel are otherwise grossly unremarkable. No upper abdominal adenopathy. Difficult to exclude perihepatic ascites. No worrisome lytic or sclerotic lesions. Degenerative changes are seen in the spine. Lower thoracic compression fracture, as before. Diffuse body wall edema.  IMPRESSION: 1. Small loculated right hydro pneumothorax with debris filling the length  of the chest tube. Amount of pleural air has decreased in the interval. 2. Progressive consolidation and airspace disease in the right lung with areas of parenchymal lucency, most consistent with pneumonia and necrosis. Followup to clearing is recommended as a centrally obstructing mass cannot be excluded. 3. Slight progression in airspace disease in the left upper and left lower  lobes. 4. Small left pleural effusion without loculation. 5. Numerous subcentimeter supraclavicular and mediastinal lymph nodes, likely reactive. 6. Marginal irregularity of the liver, indicative of cirrhosis.   Electronically Signed   By: Leanna BattlesMelinda  Blietz M.D.   On: 08/25/2013 15:31   Dg Chest Port 1 View  08/27/2013   CLINICAL DATA:  Followup of pneumothorax.  CHF.  Tracheostomy.  EXAM: PORTABLE CHEST - 1 VIEW  COMPARISON:  DG CHEST 1V PORT dated 08/26/2013; DG CHEST 1V PORT dated 08/13/2013  FINDINGS: Nasogastric terminates at the body of the stomach. The side port is likely just at or above the gastroesophageal junction.  Left-sided central line is unchanged. Tracheostomy appropriately position. Patient rotated minimally right. The right-sided chest tube has been removed. Inferior lateral right-sided pneumothorax is minimally decreased. No apical component identified.  Cardiomegaly. Similar left and worsened right-sided airspace disease. Lower lobe predominant.  IMPRESSION: Right-sided chest tube removal with slight decrease in inferior lateral loculated right-sided pneumothorax.  Slight worsening right and similar left-sided airspace disease. Edema and/or infection.   Electronically Signed   By: Jeronimo GreavesKyle  Talbot M.D.   On: 08/27/2013 08:07   Dg Chest Port 1 View  08/26/2013   CLINICAL DATA:  Pneumothorax  EXAM: PORTABLE CHEST - 1 VIEW  COMPARISON:  08/26/2013 656 hrs  FINDINGS: Cardiac shadow is stable. A left central venous line is again noted. A tracheostomy tube is again seen just above the aortic knob. A nasogastric catheter is seen within the stomach. Chronic changes are noted bilaterally stable from the prior exam. The right basilar pneumothorax is stable. A right-sided chest tube is within normal limits.  IMPRESSION: No change from the previous exam pre   Electronically Signed   By: Alcide CleverMark  Lukens M.D.   On: 08/26/2013 10:52   Dg Chest Port 1 View  08/26/2013   CLINICAL DATA:  Pneumothorax, chest pain   EXAM: PORTABLE CHEST - 1 VIEW  COMPARISON:  CT CHEST W/CM dated 08/25/2013; DG CHEST 1V PORT dated 08/21/2013  FINDINGS: Tracheostomy tube is appropriately positioned. Left IJ approach central line terminates over the distal SVC. Right-sided chest tube is in place with tip at the right lung apex. Right lung base incompletely included in the field of view, but no significant change in small loculated right basilar hydropneumothorax. No mediastinal shift. Nasogastric tube tip terminates below the level of the hemidiaphragms but is not included in the field of view. Bibasilar airspace opacities persist. Trace left pleural effusion again noted. The overall degree of aeration is slightly improved.  IMPRESSION: Slightly improved degree of aeration.   Electronically Signed   By: Christiana PellantGretchen  Green M.D.   On: 08/26/2013 07:55   Dg Chest Port 1 View  08/25/2013   CLINICAL DATA:  Pneumothorax, chest tube  EXAM: PORTABLE CHEST - 1 VIEW  COMPARISON:  DG CHEST 1V PORT dated 08/21/2013; DG CHEST 1V PORT dated 08/20/2013; DG CHEST 1V PORT dated 08/20/2013; CT CHEST W/O CM dated 08/15/2013  FINDINGS: There is a right-sided chest tube directed towards the apex. There is a right basilar pneumothorax. There is right mid lung opacity which is unchanged from the prior exam. There  is bilateral interstitial and alveolar airspace opacities. There is a small left pleural effusion. The tracheostomy tube is in unchanged position. There is a nasogastric tube and a left-sided subclavian central venous catheter in unchanged positions. Stable cardiomediastinal silhouette.  IMPRESSION: Right-sided chest tube directed towards the apex with a right basilar pneumothorax.  Bilateral interstitial and alveolar airspace opacities unchanged in the prior exam. This may reflect multi lobar pneumonia versus pulmonary edema.   Electronically Signed   By: Elige Ko   On: 08/25/2013 12:25    ASSESSMENT:  Acute respiratory failure Pneumonia ( Pseudomonas,  MRSA) COPD, no evidence of exacerbation CHF, with possible exacerbation Tension pneumothorax R> improved on 30cm suction, now clotted of 2/9/dc chest tube 2/19 with no need to replace Neuro-muscular weakness ( GBS vs post CVA vs ICU myopathy / neuropathy ) Lung necrosis, PNA  PLAN: -malfunctioning CT dc'd 2/10, without distess or vent worsening -repeat cxr daily x 2 days, next 2/12 - CT reviewed with radiology, improved overall -ABX regimen,with necrosis , ensure polymicrobial and anaerobic empiric -attempt PS per protocol, is doing for first time PS 20 today, goal to 18 in am  -no role replacement CT at this stage, PTX smaller and clinically progressing thus far as well as min CT effusion residual  Brett Canales Minor ACNP [facility] Pager [phone] till 3 pm If no answer page [phone] 08/27/2013, 11:24 AM   I have fully examined this patient and agree with above findings.    And edite din full  Mcarthur Rossetti. Tyson Alias, MD, FACP Pgr: [phone] [place] [facility]

## 2013-08-28 ENCOUNTER — Other Ambulatory Visit (HOSPITAL_COMMUNITY): Payer: Medicaid Other

## 2013-08-28 LAB — BASIC METABOLIC PANEL
BUN: 8 mg/dL (ref 6–23)
CALCIUM: 7.9 mg/dL — AB (ref 8.4–10.5)
CHLORIDE: 102 meq/L (ref 96–112)
CO2: 25 mEq/L (ref 19–32)
CREATININE: 0.51 mg/dL (ref 0.50–1.35)
GFR calc non Af Amer: >90 mL/min (ref >90)
Glucose, Bld: 135 mg/dL — ABNORMAL HIGH (ref 70–99)
Potassium: 4 mEq/L (ref 3.7–5.3)
SODIUM: 136 meq/L — AB (ref 137–147)

## 2013-08-28 LAB — CBC
HCT: 26.5 % — ABNORMAL LOW (ref 39.0–52.0)
Hemoglobin: 8.4 g/dL — ABNORMAL LOW (ref 13.0–17.0)
MCH: 27.8 pg (ref 26.0–34.0)
MCHC: 31.7 g/dL (ref 30.0–36.0)
MCV: 87.7 fL (ref 78.0–100.0)
PLATELETS: 330 10*3/uL (ref 150–400)
RBC: 3.02 MIL/uL — AB (ref 4.22–5.81)
RDW: 18.3 % — ABNORMAL HIGH (ref 11.5–15.5)
WBC: 16.7 10*3/uL — ABNORMAL HIGH (ref 4.0–10.5)

## 2013-08-28 LAB — PROTIME-INR
INR: 1.58 — AB (ref 0.00–1.49)
PROTHROMBIN TIME: 18.4 s — AB (ref 11.6–15.2)

## 2013-08-28 LAB — APTT: aPTT: 59 seconds — ABNORMAL HIGH (ref 24–37)

## 2013-08-28 NOTE — Progress Notes (Signed)
GI seeing patient for possible PEG placement.  Patient's coags worse today.  I ordered a dose of Vitamin K 10 mg SQ to be given yesterday, 2/11, and one for today, 2/12, however, the orders were never entered; patient did not receive the dose yesterday.  I spoke with his nurse, Margaretha GlassingLoretta, and asked that she be sure the orders get entered and that he get a dose today.  Repeat coags are entered to be checked tomorrow, 2/13.

## 2013-08-29 ENCOUNTER — Other Ambulatory Visit (HOSPITAL_COMMUNITY): Payer: Medicaid Other

## 2013-08-29 DIAGNOSIS — I6350 Cerebral infarction due to unspecified occlusion or stenosis of unspecified cerebral artery: Secondary | ICD-10-CM | POA: Diagnosis not present

## 2013-08-29 DIAGNOSIS — R4182 Altered mental status, unspecified: Secondary | ICD-10-CM | POA: Diagnosis not present

## 2013-08-29 DIAGNOSIS — D689 Coagulation defect, unspecified: Secondary | ICD-10-CM | POA: Diagnosis not present

## 2013-08-29 DIAGNOSIS — J9600 Acute respiratory failure, unspecified whether with hypoxia or hypercapnia: Secondary | ICD-10-CM | POA: Diagnosis not present

## 2013-08-29 LAB — PROTIME-INR
INR: 1.62 — ABNORMAL HIGH (ref 0.00–1.49)
Prothrombin Time: 18.8 seconds — ABNORMAL HIGH (ref 11.6–15.2)

## 2013-08-29 LAB — APTT: APTT: 55 s — AB (ref 24–37)

## 2013-08-29 NOTE — Progress Notes (Signed)
Patient did not receive Vitamin K again yesterday.  Coags obviously still abnormal today.  Gave pharmacy verbal order for vitamin K 10 mg SQ x 3 days (today, tomorrow, and Sunday).  Will recheck coags and follow-up again on Monday, 2/16.

## 2013-08-29 NOTE — Progress Notes (Signed)
Waiting for coags to be drawn today.  Will follow-up later.

## 2013-08-29 NOTE — Progress Notes (Signed)
PULMONARY / CRITICAL CARE MEDICINE  Name: Ralph Rowe MRN: [id number] DOB: July 03, 1951    ADMISSION DATE:  08/13/2013 CONSULTATION DATE:  08/14/2013  BRIEF PATIENT DESCRIPTION: 63 yo male with COPD, CHF, HTN initially admitted to [facility] with pseudomonas, MRSA, PNA requiring intubation.  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  Unable to wean, transferred to [facility] for further vent weaning and rehab.   SIGNIFICANT EVENTS / STUDIES:  1/19  Respiratory failure / ETT 1/29 Tension pneumothorax / R chest tube placed 2/9 ct chest- improved ptx, effusion, necrotic pna, tpa failure in ct 2/10- no hemodynamic changes with chest tube clotted 2/11 CT removed  CULTURES: Per ssh  ANTIBIOTICS:  SUBJECTIVE: weaning improved  PHYSICAL EXAMINATION:  Vital signs reviewed. Abnormal values will appear under impression plan section.   General: comfortable on vent HEENT: Trach in place PULM: ronchi rt greater left improved CV: n1 S1/S2 AB: BS+, soft Ext: edema noted throughout  Neuro: rass -1, follows commands   Recent Labs Lab 08/24/13 0500 08/26/13 0045 08/27/13 0632 08/28/13 0500 08/29/13 1012  HGB 8.2*  --  9.0* 8.4*  --   WBC 16.6*  --  20.4* 16.7*  --   PLT 230  --  316 330  --   NA 139  --  138 136*  --   K 4.1  --  4.2 4.0  --   CL 101  --  101 102  --   CO2 25  --  24 25  --   GLUCOSE 87  --  163* 135*  --   BUN 11  --  8 8  --   CREATININE 0.58  --  0.45* 0.51  --   CALCIUM 7.5*  --  7.7* 7.9*  --   INR  --  1.69* 1.52* 1.58* 1.62*  APTT  --  55* 55* 59* 55*   Dg Chest Port 1 View  08/29/2013   CLINICAL DATA:  Chronic ventilator dependent respiratory failure follow-up right basilar pneumothorax the pneumonia in both lungs  EXAM: PORTABLE CHEST - 1 VIEW  COMPARISON:  DG CHEST 1V PORT dated 08/28/2013; DG CHEST 1V PORT dated 08/27/2013; DG CHEST 1V PORT dated 08/26/2013; DG CHEST 1V PORT dated  08/26/2013; CT CHEST W/O CM dated 08/15/2013; CT CHEST W/CM dated 08/25/2013  FINDINGS: Loculated lateral hydropneumothorax on the right, increased in size since yesterday. Fluid in the minor fissure. Airspace consolidation throughout the right lung and at the left lung base, unchanged. Airspace opacities in the left upper lobe to a lesser degree. No new pulmonary parenchymal abnormalities. Cardiac silhouette upper normal in size, unchanged. Pulmonary vascularity normal. Tracheostomy tube tip in satisfactory position below the thoracic inlet approximately 6-7 cm above the carina. Left jugular since been is catheter tip projects over the mid SVC. Nasogastric tube courses below the diaphragm into the stomach.  IMPRESSION: 1. Support apparatus satisfactory. 2. Loculated right lateral hydropneumothorax, increased in size since yesterday. 3. Stable pneumonia throughout both lungs. No new pulmonary parenchymal abnormalities.   Electronically Signed   By: Hulan Saas M.D.   On: 08/29/2013 08:00   Dg Chest Port 1 View  08/28/2013   CLINICAL DATA:  Evaluate pneumothorax  EXAM: PORTABLE CHEST - 1 VIEW  COMPARISON:  DG CHEST 1V PORT dated 08/27/2013; DG CHEST 1V PORT dated 08/26/2013; DG CHEST 1V PORT dated 08/21/2013; CT CHEST W/CM dated 08/25/2013  FINDINGS: Grossly unchanged enlarged cardiac  silhouette and mediastinal contours. Stable position of support apparatus. Small loculated right-sided hydro pneumothorax grossly unchanged. Extensive bilateral mid and lower lung predominant heterogeneous airspace opacities are grossly unchanged, right greater than left. A minimal amount of enteric contrast is seen within the splenic flexure of the colon. Grossly unchanged bones.  IMPRESSION: 1.  Stable positioning of support apparatus.  No pneumothorax. 2. Grossly unchanged small loculated right-sided hydro pneumothorax. 3. Similar-appearing findings of extensive multi lobar infection, right greater than left.   Electronically Signed    By: Simonne ComeJohn  Watts M.D.   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 Tyson AliasFeinstein, MD, FACP Pgr: [phone] [place] [facility]

## 2013-08-31 ENCOUNTER — Institutional Professional Consult (permissible substitution) (HOSPITAL_COMMUNITY): Payer: Medicaid Other

## 2013-08-31 LAB — BASIC METABOLIC PANEL
BUN: 12 mg/dL (ref 6–23)
CALCIUM: 7.6 mg/dL — AB (ref 8.4–10.5)
CO2: 28 meq/L (ref 19–32)
Chloride: 100 mEq/L (ref 96–112)
Creatinine, Ser: 0.41 mg/dL — ABNORMAL LOW (ref 0.50–1.35)
GFR calc Af Amer: >90 mL/min (ref >90)
GLUCOSE: 225 mg/dL — AB (ref 70–99)
Potassium: 4.8 mEq/L (ref 3.7–5.3)
Sodium: 135 mEq/L — ABNORMAL LOW (ref 137–147)

## 2013-08-31 LAB — CBC
HCT: 26.3 % — ABNORMAL LOW (ref 39.0–52.0)
Hemoglobin: 8.4 g/dL — ABNORMAL LOW (ref 13.0–17.0)
MCH: 28.8 pg (ref 26.0–34.0)
MCHC: 31.9 g/dL (ref 30.0–36.0)
MCV: 90.1 fL (ref 78.0–100.0)
Platelets: 338 10*3/uL (ref 150–400)
RBC: 2.92 MIL/uL — ABNORMAL LOW (ref 4.22–5.81)
RDW: 19.6 % — ABNORMAL HIGH (ref 11.5–15.5)
WBC: 18.6 10*3/uL — ABNORMAL HIGH (ref 4.0–10.5)

## 2013-09-01 DIAGNOSIS — I6350 Cerebral infarction due to unspecified occlusion or stenosis of unspecified cerebral artery: Secondary | ICD-10-CM | POA: Diagnosis not present

## 2013-09-01 DIAGNOSIS — J811 Chronic pulmonary edema: Secondary | ICD-10-CM

## 2013-09-01 DIAGNOSIS — J90 Pleural effusion, not elsewhere classified: Secondary | ICD-10-CM | POA: Diagnosis not present

## 2013-09-01 DIAGNOSIS — J189 Pneumonia, unspecified organism: Secondary | ICD-10-CM

## 2013-09-01 DIAGNOSIS — J9600 Acute respiratory failure, unspecified whether with hypoxia or hypercapnia: Secondary | ICD-10-CM | POA: Diagnosis not present

## 2013-09-01 LAB — CBC
HCT: 25.7 % — ABNORMAL LOW (ref 39.0–52.0)
Hemoglobin: 8.2 g/dL — ABNORMAL LOW (ref 13.0–17.0)
MCH: 28.6 pg (ref 26.0–34.0)
MCHC: 31.9 g/dL (ref 30.0–36.0)
MCV: 89.5 fL (ref 78.0–100.0)
Platelets: 297 10*3/uL (ref 150–400)
RBC: 2.87 MIL/uL — ABNORMAL LOW (ref 4.22–5.81)
RDW: 20.1 % — ABNORMAL HIGH (ref 11.5–15.5)
WBC: 19.5 10*3/uL — ABNORMAL HIGH (ref 4.0–10.5)

## 2013-09-01 LAB — BLOOD GAS, ARTERIAL
Acid-Base Excess: 5.6 mmol/L — ABNORMAL HIGH (ref 0.0–2.0)
Bicarbonate: 29.4 mEq/L — ABNORMAL HIGH (ref 20.0–24.0)
FIO2: 0.35 %
MECHVT: 500 mL
O2 Saturation: 100 %
PATIENT TEMPERATURE: 98.6
PEEP/CPAP: 5 cmH2O
RATE: 22 resp/min
TCO2: 30.7 mmol/L (ref 0–100)
pCO2 arterial: 41.3 mmHg (ref 35.0–45.0)
pH, Arterial: 7.466 — ABNORMAL HIGH (ref 7.350–7.450)
pO2, Arterial: 140 mmHg — ABNORMAL HIGH (ref 80.0–100.0)

## 2013-09-01 LAB — APTT: aPTT: 44 seconds — ABNORMAL HIGH (ref 24–37)

## 2013-09-01 LAB — PROTIME-INR
INR: 1.44 (ref 0.00–1.49)
Prothrombin Time: 17.2 seconds — ABNORMAL HIGH (ref 11.6–15.2)

## 2013-09-01 NOTE — Progress Notes (Signed)
PULMONARY  / CRITICAL CARE MEDICINE  Name: Ralph Rowe MRN: [id number] DOB: 10-30-1950  CONSULTATION DATE: 08/14/2013  BRIEF PATIENT DESCRIPTION: 63 yo with COPD and CHF [facility] Pseud[facility] / MRSA pneumonia requiring intubation. 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 Unable to wean, [facility]. Since transfer developed tension pneumothorax, requiring tube thoracotomy on 1/29, which was removed 2/11.  CULTURES: Recent Results (from the past 720 hour(s))  CULTURE, BLOOD (ROUTINE X 2)     Status: None   Collection Time    08/13/13  8:10 PM      Result Value Ref Range Status   Specimen Description BLOOD RIGHT HAND   Final   Special Requests BOTTLES DRAWN AEROBIC ONLY 10CC   Final   Culture  Setup Time     Final   Value: 08/14/2013 00:59     Performed at [facility]   Culture     Final   Value: NO GROWTH 5 DAYS     Performed at [facility]   Report Status 08/20/2013 FINAL   Final  CULTURE, BLOOD (ROUTINE X 2)     Status: None   Collection Time    08/13/13  8:15 PM      Result Value Ref Range Status   Specimen Description BLOOD CENTRAL LINE   Final   Special Requests BOTTLES DRAWN AEROBIC AND ANAEROBIC 10CC   Final   Culture  Setup Time     Final   Value: 08/14/2013 00:59     Performed at [facility]   Culture     Final   Value: METHICILLIN RESISTANT STAPHYLOCOCCUS AUREUS     Note: RIFAMPIN AND GENTAMICIN SHOULD NOT BE USED AS SINGLE DRUGS FOR TREATMENT OF STAPH INFECTIONS. CRITICAL RESULT CALLED TO, READ BACK BY AND VERIFIED WITH: DESIREE TAYLOR 2/2/15MITHERSJ     Note: Gram Stain Report Called to,Read Back By and Verified With: ALEXANDER AT 0256 08/16/13 BY SNOLO     Performed at [facility]   Report Status 08/18/2013 FINAL   Final   Organism ID, Bacteria METHICILLIN RESISTANT STAPHYLOCOCCUS AUREUS   Final  CULTURE, RESPIRATORY (NON-EXPECTORATED)     Status: None   Collection Time    08/14/13  2:10 PM      Result Value Ref Range Status   Specimen Description TRACHEAL ASPIRATE   Final   Special Requests NONE   Final   Gram Stain     Final   Value: MODERATE WBC PRESENT, PREDOMINANTLY PMN     NO SQUAMOUS EPITHELIAL CELLS SEEN     ABUNDANT GRAM NEGATIVE RODS     MODERATE GRAM POSITIVE COCCI     IN CLUSTERS   Culture     Final   Value: MODERATE PSEUDOMONAS AERUGINOSA     Performed at [facility]   Report Status 08/19/2013 FINAL   Final   Organism ID, Bacteria PSEUDOMONAS AERUGINOSA   Final  CULTURE, BLOOD (ROUTINE X 2)     Status: None   Collection Time    08/15/13  2:52 PM      Result Value Ref Range Status   Specimen Description BLOOD LEFT HAND   Final   Special Requests     Final   Value: BOTTLES DRAWN AEROBIC AND ANAEROBIC BLUE 10CC RED 5CC   Culture  Setup Time     Final   Value: 08/16/2013 02:05     Performed at [facility]   Culture     Final   Value: NO GROWTH 5 DAYS     Performed at [facility]   Report Status 08/22/2013 FINAL   Final  CULTURE, BLOOD (ROUTINE X 2)     Status: None   Collection Time    08/15/13  3:06 PM      Result Value Ref Range Status   Specimen Description BLOOD RIGHT HAND   Final   Special Requests BOTTLES DRAWN AEROBIC ONLY 4 CC   Final   Culture  Setup Time     Final   Value: 08/16/2013 02:05     Performed at [facility]   Culture     Final   Value: NO GROWTH 5 DAYS     Performed at [facility]   Report Status 08/22/2013 FINAL   Final  MALARIA SMEAR     Status: None   Collection Time    08/16/13  5:00 AM      Result Value Ref Range Status   Specimen Description BLOOD   Final   Special Requests NONE   Final   Malaria Prep     Final   Value: No Plasmodium or Other Blood Parasites Seen on Thick or Thin Smears For persons strongly suspected of having a blood parasite,but have negative smears, it is recommended that blood films be repeated approximately every 12  to 24 hours for 3 consecutive      days.     Performed at [facility]   Report Status 08/17/2013 FINAL   Final  CULTURE, RESPIRATORY (NON-EXPECTORATED)     Status: None   Collection Time    08/17/13  9:47 AM      Result Value Ref Range Status   Specimen Description TRACHEAL ASPIRATE   Final   Special Requests NONE   Final   Gram Stain     Final   Value: ABUNDANT WBC PRESENT, PREDOMINANTLY PMN     FEW SQUAMOUS EPITHELIAL CELLS PRESENT     FEW YEAST     FEW GRAM NEGATIVE RODS     Performed at [facility]   Culture     Final   Value: ABUNDANT PSEUDOMONAS AERUGINOSA     MODERATE CANDIDA ALBICANS     Performed at [facility]   Report Status 08/21/2013 FINAL   Final   Organism ID, Bacteria PSEUDOMONAS AERUGINOSA   Final  AFB CULTURE WITH SMEAR     Status: None   Collection Time    08/18/13  1:48 AM      Result Value Ref Range Status   Specimen Description SPUTUM   Final   Special Requests NONE   Final   ACID FAST SMEAR     Final   Value: NO ACID FAST BACILLI SEEN     Performed at [facility]   Culture     Final   Value: CULTURE WILL BE EXAMINED FOR 6 WEEKS BEFORE ISSUING A FINAL REPORT     Performed at [facility]   Report Status PENDING   Incomplete  PNEUMOCYSTIS JIROVECI SMEAR BY DFA     Status: None   Collection Time    08/18/13  2:07 AM      Result Value Ref Range Status   Specimen Source-PJSRC SPUTUM   Final   Pneumocystis jiroveci Ag NEGATIVE   Final   Comment: Performed at [facility]  CULTURE, RESPIRATORY (NON-EXPECTORATED)  Status: None   Collection Time    08/20/13 11:51 AM      Result Value Ref Range Status   Specimen Description TRACHEAL ASPIRATE   Final   Special Requests NONE   Final   Gram Stain     Final   Value: ABUNDANT WBC PRESENT,BOTH PMN AND MONONUCLEAR     NO SQUAMOUS EPITHELIAL CELLS SEEN     FEW GRAM POSITIVE COCCI IN PAIRS     RARE GRAM NEGATIVE RODS     Performed at [facility]   Culture     Final   Value: ABUNDANT PSEUDOMONAS AERUGINOSA     Performed at [facility]   Report Status 08/22/2013 FINAL   Final   Organism ID, Bacteria PSEUDOMONAS AERUGINOSA   Final   ANTIBIOTICS:  Reviewed    INTERVAL HISTORY: No active issues, tolerating weaning well  PHYSICAL EXAMINATION:  Vital signs: Reviewed General:  Appears chronically ill, mechanically ventilated, synchronous Neuro:  Appears obtunded, but arouses to stimulation and responds appropriatelly HEENT:  PERRL, OETT / OGT Cardiovascular:  RRR, no m/r/g Lungs:  Bilateral air entry, rhonchi Abdomen:  Soft, nontender, bowel sounds diminished Musculoskeletal:  Anasarca Skin:  No rash  LABS:  Recent Labs Lab 08/27/13 0632 08/28/13 0500 08/31/13 0500  NA 138 136* 135*  K 4.2 4.0 4.8  CL 101 102 100  CO2 24 25 28   BUN 8 8 12   CREATININE 0.45* 0.51 0.41*  GLUCOSE 163* 135* 225*    Recent Labs Lab 08/28/13 0500 08/31/13 0500 09/01/13 0615  HGB 8.4* 8.4* 8.2*  HCT 26.5* 26.3* 25.7*  WBC 16.7* 18.6* 19.5*  PLT 330 338 297   IMAGING: Dg Chest Port 1 View  08/31/2013   CLINICAL DATA:  Pneumothorax  EXAM: PORTABLE CHEST - 1 VIEW  COMPARISON:  DG CHEST 1V PORT dated 08/29/2013; DG CHEST 1V PORT dated 08/26/2013; CT CHEST W/CM dated 08/25/2013; DG CHEST 1V PORT dated 08/21/2013  FINDINGS: Grossly unchanged cardiac silhouette and mediastinal contours. Stable position of support apparatus. There has been interval reduction in the amount of air of previously noted loculated right basilar hydro pneumothorax with trace amount of residual loculated air about the peripheral aspect of the right mid lung. Worsening bilateral pleural effusions and associated bibasilar opacities. Pulmonary vasculature remains indistinct with cephalization of flow. Grossly unchanged bones.  IMPRESSION: 1. Interval reduction in persistent trace amount of air within tiny residual loculated right-sided hydro pneumothorax. 2.   Stable positioning of support apparatus. 3. Worsening pulmonary edema with now small to moderate size bilateral effusions and associated bibasilar opacities, atelectasis versus infiltrate.   Electronically Signed   By: Simonne ComeJohn  Watts M.D.   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  Lonia FarberZUBELEVITSKIY, Kambria Grima, MD Pulmonary and Critical Care Medicine [facility] Pager: [phone]  09/01/2013, 1:31 PM
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
## 2013-09-03 ENCOUNTER — Other Ambulatory Visit (HOSPITAL_COMMUNITY): Payer: Medicaid Other

## 2013-09-03 LAB — BASIC METABOLIC PANEL
BUN: 14 mg/dL (ref 6–23)
CO2: 35 mEq/L — ABNORMAL HIGH (ref 19–32)
Calcium: 7.7 mg/dL — ABNORMAL LOW (ref 8.4–10.5)
Chloride: 94 mEq/L — ABNORMAL LOW (ref 96–112)
Creatinine, Ser: 0.41 mg/dL — ABNORMAL LOW (ref 0.50–1.35)
GLUCOSE: 146 mg/dL — AB (ref 70–99)
POTASSIUM: 4.7 meq/L (ref 3.7–5.3)
SODIUM: 133 meq/L — AB (ref 137–147)

## 2013-09-03 LAB — PROCALCITONIN: Procalcitonin: 0.1 ng/mL

## 2013-09-03 LAB — CBC
HCT: 24.8 % — ABNORMAL LOW (ref 39.0–52.0)
HEMOGLOBIN: 8.1 g/dL — AB (ref 13.0–17.0)
MCH: 29.5 pg (ref 26.0–34.0)
MCHC: 32.7 g/dL (ref 30.0–36.0)
MCV: 90.2 fL (ref 78.0–100.0)
Platelets: 297 10*3/uL (ref 150–400)
RBC: 2.75 MIL/uL — ABNORMAL LOW (ref 4.22–5.81)
RDW: 20.8 % — AB (ref 11.5–15.5)
WBC: 16 10*3/uL — ABNORMAL HIGH (ref 4.0–10.5)

## 2013-09-04 ENCOUNTER — Other Ambulatory Visit (HOSPITAL_COMMUNITY): Payer: Medicaid Other

## 2013-09-05 LAB — BASIC METABOLIC PANEL
BUN: 13 mg/dL (ref 6–23)
CHLORIDE: 95 meq/L — AB (ref 96–112)
CO2: 32 mEq/L (ref 19–32)
CREATININE: 0.38 mg/dL — AB (ref 0.50–1.35)
Calcium: 8 mg/dL — ABNORMAL LOW (ref 8.4–10.5)
Glucose, Bld: 178 mg/dL — ABNORMAL HIGH (ref 70–99)
POTASSIUM: 5.2 meq/L (ref 3.7–5.3)
Sodium: 134 mEq/L — ABNORMAL LOW (ref 137–147)

## 2013-09-05 LAB — CBC
HCT: 26 % — ABNORMAL LOW (ref 39.0–52.0)
HEMOGLOBIN: 8.3 g/dL — AB (ref 13.0–17.0)
MCH: 29 pg (ref 26.0–34.0)
MCHC: 31.9 g/dL (ref 30.0–36.0)
MCV: 90.9 fL (ref 78.0–100.0)
Platelets: 341 10*3/uL (ref 150–400)
RBC: 2.86 MIL/uL — ABNORMAL LOW (ref 4.22–5.81)
RDW: 21.7 % — AB (ref 11.5–15.5)
WBC: 19.5 10*3/uL — ABNORMAL HIGH (ref 4.0–10.5)

## 2013-09-06 ENCOUNTER — Other Ambulatory Visit (HOSPITAL_COMMUNITY): Payer: Medicaid Other

## 2013-09-07 ENCOUNTER — Other Ambulatory Visit (HOSPITAL_COMMUNITY): Payer: Medicaid Other

## 2013-09-07 LAB — BASIC METABOLIC PANEL
BUN: 13 mg/dL (ref 6–23)
CHLORIDE: 94 meq/L — AB (ref 96–112)
CO2: 28 meq/L (ref 19–32)
Calcium: 8.4 mg/dL (ref 8.4–10.5)
Creatinine, Ser: 0.37 mg/dL — ABNORMAL LOW (ref 0.50–1.35)
GFR calc non Af Amer: >90 mL/min (ref >90)
Glucose, Bld: 157 mg/dL — ABNORMAL HIGH (ref 70–99)
POTASSIUM: 4.8 meq/L (ref 3.7–5.3)
Sodium: 129 mEq/L — ABNORMAL LOW (ref 137–147)

## 2013-09-07 LAB — CBC
HCT: 26.7 % — ABNORMAL LOW (ref 39.0–52.0)
Hemoglobin: 8.6 g/dL — ABNORMAL LOW (ref 13.0–17.0)
MCH: 29.4 pg (ref 26.0–34.0)
MCHC: 32.2 g/dL (ref 30.0–36.0)
MCV: 91.1 fL (ref 78.0–100.0)
PLATELETS: 389 10*3/uL (ref 150–400)
RBC: 2.93 MIL/uL — AB (ref 4.22–5.81)
RDW: 22.3 % — AB (ref 11.5–15.5)
WBC: 18.7 10*3/uL — ABNORMAL HIGH (ref 4.0–10.5)

## 2013-09-08 ENCOUNTER — Other Ambulatory Visit (HOSPITAL_COMMUNITY): Payer: Medicaid Other

## 2013-09-08 LAB — PRO B NATRIURETIC PEPTIDE: PRO B NATRI PEPTIDE: 1506 pg/mL — AB (ref 0–125)

## 2013-09-08 NOTE — Progress Notes (Signed)
PULMONARY  / CRITICAL CARE MEDICINE  Name: Ralph Rowe MRN: [id number] DOB: 10-30-1950  CONSULTATION DATE: 08/14/2013  BRIEF PATIENT DESCRIPTION: 63 yo with COPD and CHF [facility] with Pseud[facility] / MRSA pneumonia requiring intubation. 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 Unable to wean[facility] Since [facility] developed tension pneumothorax, requiring tube thoracotomy on 1/29, which was removed 2/11.  CULTURES: Recent Results (from the past 720 hour(s))  CULTURE, BLOOD (ROUTINE X 2)     Status: None   Collection Time    08/13/13  8:10 PM      Result Value Ref Range Status   Specimen Description BLOOD RIGHT HAND   Final   Special Requests BOTTLES DRAWN AEROBIC ONLY 10CC   Final   Culture  Setup Time     Final   Value: 08/14/2013 00:59     Performed at [facility]   Culture     Final   Value: NO GROWTH 5 DAYS     Performed at [facility]   Report Status 08/20/2013 FINAL   Final  CULTURE, BLOOD (ROUTINE X 2)     Status: None   Collection Time    08/13/13  8:15 PM      Result Value Ref Range Status   Specimen Description BLOOD CENTRAL LINE   Final   Special Requests BOTTLES DRAWN AEROBIC AND ANAEROBIC 10CC   Final   Culture  Setup Time     Final   Value: 08/14/2013 00:59     Performed at [facility]   Culture     Final   Value: METHICILLIN RESISTANT STAPHYLOCOCCUS AUREUS     Note: RIFAMPIN AND GENTAMICIN SHOULD NOT BE USED AS SINGLE DRUGS FOR TREATMENT OF STAPH INFECTIONS. CRITICAL RESULT CALLED TO, READ BACK BY AND VERIFIED WITH: DESIREE TAYLOR 2/2/15MITHERSJ     Note: Gram Stain Report Called to,Read Back By and Verified With: ALEXANDER AT 0256 08/16/13 BY SNOLO     Performed at [facility]   Report Status 08/18/2013 FINAL   Final   Organism ID, Bacteria METHICILLIN RESISTANT STAPHYLOCOCCUS AUREUS   Final  CULTURE, RESPIRATORY (NON-EXPECTORATED)     Status: None   Collection Time    08/14/13  2:10 PM      Result Value Ref Range Status   Specimen Description TRACHEAL ASPIRATE   Final   Special Requests NONE   Final   Gram Stain     Final   Value: MODERATE WBC PRESENT, PREDOMINANTLY PMN     NO SQUAMOUS EPITHELIAL CELLS SEEN     ABUNDANT GRAM NEGATIVE RODS     MODERATE GRAM POSITIVE COCCI     IN CLUSTERS   Culture     Final   Value: MODERATE PSEUDOMONAS AERUGINOSA     Performed at [facility]   Report Status 08/19/2013 FINAL   Final   Organism ID, Bacteria PSEUDOMONAS AERUGINOSA   Final  CULTURE, BLOOD (ROUTINE X 2)     Status: None   Collection Time    08/15/13  2:52 PM      Result Value Ref Range Status   Specimen Description BLOOD LEFT HAND   Final   Special Requests     Final   Value: BOTTLES DRAWN AEROBIC AND ANAEROBIC BLUE 10CC RED 5CC   Culture  Setup Time     Final   Value: 08/16/2013 02:05     Performed at [facility]   Culture     Final   Value: NO GROWTH 5 DAYS     Performed at [facility]   Report Status 08/22/2013 FINAL   Final  CULTURE, BLOOD (ROUTINE X 2)     Status: None   Collection Time    08/15/13  3:06 PM      Result Value Ref Range Status   Specimen Description BLOOD RIGHT HAND   Final   Special Requests BOTTLES DRAWN AEROBIC ONLY 4 CC   Final   Culture  Setup Time     Final   Value: 08/16/2013 02:05     Performed at [facility]   Culture     Final   Value: NO GROWTH 5 DAYS     Performed at [facility]   Report Status 08/22/2013 FINAL   Final  MALARIA SMEAR     Status: None   Collection Time    08/16/13  5:00 AM      Result Value Ref Range Status   Specimen Description BLOOD   Final   Special Requests NONE   Final   Malaria Prep     Final   Value: No Plasmodium or Other Blood Parasites Seen on Thick or Thin Smears For persons strongly suspected of having a blood parasite,but have negative smears, it is recommended that blood films be repeated approximately every 12  to 24 hours for 3 consecutive      days.     Performed at [facility]   Report Status 08/17/2013 FINAL   Final  CULTURE, RESPIRATORY (NON-EXPECTORATED)     Status: None   Collection Time    08/17/13  9:47 AM      Result Value Ref Range Status   Specimen Description TRACHEAL ASPIRATE   Final   Special Requests NONE   Final   Gram Stain     Final   Value: ABUNDANT WBC PRESENT, PREDOMINANTLY PMN     FEW SQUAMOUS EPITHELIAL CELLS PRESENT     FEW YEAST     FEW GRAM NEGATIVE RODS     Performed at [facility]   Culture     Final   Value: ABUNDANT PSEUDOMONAS AERUGINOSA     MODERATE CANDIDA ALBICANS     Performed at [facility]   Report Status 08/21/2013 FINAL   Final   Organism ID, Bacteria PSEUDOMONAS AERUGINOSA   Final  AFB CULTURE WITH SMEAR     Status: None   Collection Time    08/18/13  1:48 AM      Result Value Ref Range Status   Specimen Description SPUTUM   Final   Special Requests NONE   Final   ACID FAST SMEAR     Final   Value: NO ACID FAST BACILLI SEEN     Performed at [facility]   Culture     Final   Value: CULTURE WILL BE EXAMINED FOR 6 WEEKS BEFORE ISSUING A FINAL REPORT     Performed at [facility]   Report Status PENDING   Incomplete  PNEUMOCYSTIS JIROVECI SMEAR BY DFA     Status: None   Collection Time    08/18/13  2:07 AM      Result Value Ref Range Status   Specimen Source-PJSRC SPUTUM   Final   Pneumocystis jiroveci Ag NEGATIVE   Final   Comment: Performed at [facility]  CULTURE, RESPIRATORY (NON-EXPECTORATED)  Status: None   Collection Time    08/20/13 11:51 AM      Result Value Ref Range Status   Specimen Description TRACHEAL ASPIRATE   Final   Special Requests NONE   Final   Gram Stain     Final   Value: ABUNDANT WBC PRESENT,BOTH PMN AND MONONUCLEAR     NO SQUAMOUS EPITHELIAL CELLS SEEN     FEW GRAM POSITIVE COCCI IN PAIRS     RARE GRAM NEGATIVE RODS     Performed at [facility]   Culture     Final   Value: ABUNDANT PSEUDOMONAS AERUGINOSA     Performed at [facility]   Report Status 08/22/2013 FINAL   Final   Organism ID, Bacteria PSEUDOMONAS AERUGINOSA   Final  CULTURE, RESPIRATORY (NON-EXPECTORATED)     Status: None   Collection Time    09/05/13  2:23 PM      Result Value Ref Range Status   Specimen Description TRACHEAL ASPIRATE   Final   Special Requests NONE   Final   Gram Stain     Final   Value: ABUNDANT WBC PRESENT,BOTH PMN AND MONONUCLEAR     NO SQUAMOUS EPITHELIAL CELLS SEEN     NO ORGANISMS SEEN     Performed at [facility]   Culture     Final   Value: MODERATE PSEUDOMONAS AERUGINOSA     Performed at [facility]   Report Status PENDING   Incomplete   ANTIBIOTICS:  Reviewed    INTERVAL HISTORY:  On trach collar x 7 days, Low grade fever +, Lot of secretions. Weight up   PHYSICAL EXAMINATION:  Vital signs: T 98/5, P 76, RR 18, BP 123/63, pulse ox 97%  General:  Appears chronically ill, on trch collar Neuro: Wide open eyes , but arouses to stimulation and does not follow commands HEENT:  PERRL, OETT / OGT Cardiovascular:  RRR, no m/r/g Lungs:  Bilateral air entry, rhonchi scattered Abdomen:  Soft, nontender, bowel sounds diminished Musculoskeletal:  Anasarca Skin:  No rash  LABS:  Recent Labs Lab 09/03/13 0610 09/05/13 0550 09/07/13 0600  NA 133* 134* 129*  K 4.7 5.2 4.8  CL 94* 95* 94*  CO2 35* 32 28  BUN 14 13 13   CREATININE 0.41* 0.38* 0.37*  GLUCOSE 146* 178* 157*    Recent Labs Lab 09/03/13 0610 09/05/13 0550 09/07/13 0600  HGB 8.1* 8.3* 8.6*  HCT 24.8* 26.0* 26.7*  WBC 16.0* 19.5* 18.7*  PLT 297 341 389   IMAGING: Dg Chest Port 1 View  09/07/2013   CLINICAL DATA:  Respiratory failure  EXAM: PORTABLE CHEST - 1 VIEW  COMPARISON:  09/06/2013  FINDINGS: Tracheostomy, left IJ central line, and NG tube are stable in position. Similar right base loculated hydro pneumothorax  laterally, without change. Diffuse bilateral asymmetric airspace process throughout the perihilar regions and both lung bases. No significant interval change. Small left effusion not excluded.  IMPRESSION: Stable persistent right base loculated hydro pneumothorax  No change in the diffuse asymmetric airspace process versus edema.   Electronically Signed   By: Ruel Favors M.D.   On: 09/07/2013 07:47   ASSESSMENT: Acute respiratory failure COPD, no evidence of exacerbation CHF with possible exacerbation Pulmonary edema Pleural effusions bilaterally Pneumonia ( PSEUDOMONAS, MRSA ) Tension pneumothorax R, resolved Neuro-muscular weakness ( GBS vs post CVA vs ICU myopathy / neuropathy ) Tracheostomy status  PLAN: Check bnp and cxr Trach collar  No  indications for thoracentesis Antibiotics per primary team    Dr. Kalman ShanMurali Joniya Boberg, M.D., [facility] Pulmonary and Critical Care Medicine Staff Physician [facility] Pager: [phone], If no answer or between  15:00h - 7:00h: call [phone]  09/08/2013 12:44 PM

## 2013-09-09 LAB — CULTURE, RESPIRATORY W GRAM STAIN

## 2013-09-09 LAB — CULTURE, RESPIRATORY

## 2013-09-10 ENCOUNTER — Other Ambulatory Visit (HOSPITAL_COMMUNITY): Payer: Medicaid Other

## 2013-09-10 DIAGNOSIS — J811 Chronic pulmonary edema: Secondary | ICD-10-CM

## 2013-09-10 DIAGNOSIS — J90 Pleural effusion, not elsewhere classified: Secondary | ICD-10-CM

## 2013-09-10 NOTE — Progress Notes (Signed)
PULMONARY  / CRITICAL CARE MEDICINE  Name: Ralph Rowe MRN: [id number] DOB: 10-30-1950  CONSULTATION DATE: 08/14/2013  BRIEF PATIENT DESCRIPTION: 63 yo with COPD and CHF [facility] Pseud[facility] / MRSA pneumonia requiring intubation. 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 Unable to wean, [facility] Since transfer developed tension pneumothorax, requiring tube thoracotomy on 1/29, which was removed 2/11.  CULTURES: Recent Results (from the past 720 hour(s))  CULTURE, BLOOD (ROUTINE X 2)     Status: None   Collection Time    08/13/13  8:10 PM      Result Value Ref Range Status   Specimen Description BLOOD RIGHT HAND   Final   Special Requests BOTTLES DRAWN AEROBIC ONLY 10CC   Final   Culture  Setup Time     Final   Value: 08/14/2013 00:59     Performed at [facility]   Culture     Final   Value: NO GROWTH 5 DAYS     Performed at [facility]   Report Status 08/20/2013 FINAL   Final  CULTURE, BLOOD (ROUTINE X 2)     Status: None   Collection Time    08/13/13  8:15 PM      Result Value Ref Range Status   Specimen Description BLOOD CENTRAL LINE   Final   Special Requests BOTTLES DRAWN AEROBIC AND ANAEROBIC 10CC   Final   Culture  Setup Time     Final   Value: 08/14/2013 00:59     Performed at [facility]   Culture     Final   Value: METHICILLIN RESISTANT STAPHYLOCOCCUS AUREUS     Note: RIFAMPIN AND GENTAMICIN SHOULD NOT BE USED AS SINGLE DRUGS FOR TREATMENT OF STAPH INFECTIONS. CRITICAL RESULT CALLED TO, READ BACK BY AND VERIFIED WITH: DESIREE TAYLOR 2/2/15MITHERSJ     Note: Gram Stain Report Called to,Read Back By and Verified With: ALEXANDER AT 0256 08/16/13 BY SNOLO     Performed at [facility]   Report Status 08/18/2013 FINAL   Final   Organism ID, Bacteria METHICILLIN RESISTANT STAPHYLOCOCCUS AUREUS   Final  CULTURE, RESPIRATORY (NON-EXPECTORATED)     Status: None   Collection Time    08/14/13  2:10 PM      Result Value Ref Range Status   Specimen Description TRACHEAL ASPIRATE   Final   Special Requests NONE   Final   Gram Stain     Final   Value: MODERATE WBC PRESENT, PREDOMINANTLY PMN     NO SQUAMOUS EPITHELIAL CELLS SEEN     ABUNDANT GRAM NEGATIVE RODS     MODERATE GRAM POSITIVE COCCI     IN CLUSTERS   Culture     Final   Value: MODERATE PSEUDOMONAS AERUGINOSA     Performed at [facility]   Report Status 08/19/2013 FINAL   Final   Organism ID, Bacteria PSEUDOMONAS AERUGINOSA   Final  CULTURE, BLOOD (ROUTINE X 2)     Status: None   Collection Time    08/15/13  2:52 PM      Result Value Ref Range Status   Specimen Description BLOOD LEFT HAND   Final   Special Requests     Final   Value: BOTTLES DRAWN AEROBIC AND ANAEROBIC BLUE 10CC RED 5CC   Culture  Setup Time     Final   Value: 08/16/2013 02:05     Performed at [facility]   Culture     Final   Value: NO GROWTH 5 DAYS     Performed at [facility]   Report Status 08/22/2013 FINAL   Final  CULTURE, BLOOD (ROUTINE X 2)     Status: None   Collection Time    08/15/13  3:06 PM      Result Value Ref Range Status   Specimen Description BLOOD RIGHT HAND   Final   Special Requests BOTTLES DRAWN AEROBIC ONLY 4 CC   Final   Culture  Setup Time     Final   Value: 08/16/2013 02:05     Performed at [facility]   Culture     Final   Value: NO GROWTH 5 DAYS     Performed at [facility]   Report Status 08/22/2013 FINAL   Final  MALARIA SMEAR     Status: None   Collection Time    08/16/13  5:00 AM      Result Value Ref Range Status   Specimen Description BLOOD   Final   Special Requests NONE   Final   Malaria Prep     Final   Value: No Plasmodium or Other Blood Parasites Seen on Thick or Thin Smears For persons strongly suspected of having a blood parasite,but have negative smears, it is recommended that blood films be repeated approximately every 12  to 24 hours for 3 consecutive      days.     Performed at [facility]   Report Status 08/17/2013 FINAL   Final  CULTURE, RESPIRATORY (NON-EXPECTORATED)     Status: None   Collection Time    08/17/13  9:47 AM      Result Value Ref Range Status   Specimen Description TRACHEAL ASPIRATE   Final   Special Requests NONE   Final   Gram Stain     Final   Value: ABUNDANT WBC PRESENT, PREDOMINANTLY PMN     FEW SQUAMOUS EPITHELIAL CELLS PRESENT     FEW YEAST     FEW GRAM NEGATIVE RODS     Performed at [facility]   Culture     Final   Value: ABUNDANT PSEUDOMONAS AERUGINOSA     MODERATE CANDIDA ALBICANS     Performed at [facility]   Report Status 08/21/2013 FINAL   Final   Organism ID, Bacteria PSEUDOMONAS AERUGINOSA   Final  AFB CULTURE WITH SMEAR     Status: None   Collection Time    08/18/13  1:48 AM      Result Value Ref Range Status   Specimen Description SPUTUM   Final   Special Requests NONE   Final   ACID FAST SMEAR     Final   Value: NO ACID FAST BACILLI SEEN     Performed at [facility]   Culture     Final   Value: CULTURE WILL BE EXAMINED FOR 6 WEEKS BEFORE ISSUING A FINAL REPORT     Performed at [facility]   Report Status PENDING   Incomplete  PNEUMOCYSTIS JIROVECI SMEAR BY DFA     Status: None   Collection Time    08/18/13  2:07 AM      Result Value Ref Range Status   Specimen Source-PJSRC SPUTUM   Final   Pneumocystis jiroveci Ag NEGATIVE   Final   Comment: Performed at [facility]  CULTURE, RESPIRATORY (NON-EXPECTORATED)  Status: None   Collection Time    08/20/13 11:51 AM      Result Value Ref Range Status   Specimen Description TRACHEAL ASPIRATE   Final   Special Requests NONE   Final   Gram Stain     Final   Value: ABUNDANT WBC PRESENT,BOTH PMN AND MONONUCLEAR     NO SQUAMOUS EPITHELIAL CELLS SEEN     FEW GRAM POSITIVE COCCI IN PAIRS     RARE GRAM NEGATIVE RODS     Performed at [facility]   Culture     Final   Value: ABUNDANT PSEUDOMONAS AERUGINOSA     Performed at [facility]   Report Status 08/22/2013 FINAL   Final   Organism ID, Bacteria PSEUDOMONAS AERUGINOSA   Final  CULTURE, RESPIRATORY (NON-EXPECTORATED)     Status: None   Collection Time    09/05/13  2:23 PM      Result Value Ref Range Status   Specimen Description TRACHEAL ASPIRATE   Final   Special Requests NONE   Final   Gram Stain     Final   Value: ABUNDANT WBC PRESENT,BOTH PMN AND MONONUCLEAR     NO SQUAMOUS EPITHELIAL CELLS SEEN     NO ORGANISMS SEEN     Performed at [facility]   Culture     Final   Value: MODERATE PSEUDOMONAS AERUGINOSA     1.0 Note: COLISTIN IS SENSITIVE     Performed at [facility]   Report Status 09/09/2013 FINAL   Final   Organism ID, Bacteria PSEUDOMONAS AERUGINOSA   Final   ANTIBIOTICS:  Reviewed    INTERVAL HISTORY:  On trach collar x 7 days, Low grade fever +, Lot of secretions. Weight up   PHYSICAL EXAMINATION:  Vital signs: T 98/5, P 76, RR 18, BP 123/63, pulse ox 97%  General:  Appears chronically ill, on trch collar Neuro: Wide open eyes , but arouses to stimulation and does not follow commands HEENT:  PERRL, OETT / OGT Cardiovascular:  RRR, no m/r/g Lungs:  Bilateral air entry, rhonchi scattered Abdomen:  Soft, nontender, bowel sounds diminished Musculoskeletal:  Anasarca Skin:  No rash  LABS:  Recent Labs Lab 09/05/13 0550 09/07/13 0600  NA 134* 129*  K 5.2 4.8  CL 95* 94*  CO2 32 28  BUN 13 13  CREATININE 0.38* 0.37*  GLUCOSE 178* 157*    Recent Labs Lab 09/05/13 0550 09/07/13 0600  HGB 8.3* 8.6*  HCT 26.0* 26.7*  WBC 19.5* 18.7*  PLT 341 389   IMAGING: US Renal  09/10/2013   CLINICAL DATA:  History of diabetes and hypertension  EXAM: RENAL/URINARY TRACT ULTRASOUND COMPLETE  COMPARISON:  CT ABD/PELVIS W CM dated 08/22/2013  FINDINGS: Right Kidney:  Length: 12 cm The echotexture of the renal cortex is  slightly greater than that of the adjacent liver. There is no hydronephrosis. There is a 1.4 x 1.7 x 1 cm diameter cyst in the lower pole of the right kidney.  Left Kidney:  Length: 12.5 cm. The renal cortical echotexture appears mildly increased. There is a lateral lower pole exophytic cystic structure measuring 3.1 x 2.5 x 2.4 cm.  Bladder:  The urinary bladder is partially distended and contains a Foley catheter.  IMPRESSION: 1. The echotexture of both kidneys is mildly increased which may reflect medical renal disease. There are simple appearing cysts in both kidneys. 2. There is no evidence of hydronephrosis.   Electronically Signed  By: David  [place]   On: 09/10/2013 11:23   Dg Chest Port 1 View  09/08/2013   CLINICAL DATA:  Followup pneumonia, tracheostomy  EXAM: PORTABLE CHEST - 1 VIEW  COMPARISON:  09/07/2013  FINDINGS: Cardiomediastinal silhouette is stable. Stable tracheostomy tube position. NG tube in place. Stable right basilar loculated hydro pneumothorax. Bilateral basilar atelectasis or infiltrate again noted. Left IJ central line unchanged in position.  IMPRESSION: Stable tracheostomy tube position. NG tube in place. Stable right basilar loculated hydro pneumothorax. Bilateral basilar atelectasis or infiltrate again noted. Left IJ central line unchanged in position.   Electronically Signed   By: Natasha Mead M.D.   On: 09/08/2013 16:10   ASSESSMENT: Acute respiratory failure COPD, no evidence of exacerbation CHF with possible exacerbation Pulmonary edema Pleural effusions bilaterally Pneumonia ( PSEUDOMONAS, MRSA ) Tension pneumothorax R, resolved Neuro-muscular weakness ( GBS vs post CVA vs ICU myopathy / neuropathy ) Tracheostomy status  PLAN: Pulmonary edema on CXR, needs additional lasix. Trach collar as tolerated and PVF. No indications for thoracentesis at this time. Antibiotics per primary team. Needs a trach SNF placement at this point, no decannulation.  Alyson Reedy, M.D. [facility] Pulmonary/Critical Care Medicine. Pager: [phone]. After hours pager: [phone].

## 2013-09-10 NOTE — Progress Notes (Signed)
Daily Rounding Note  09/11/2013, 9:08 AM  LOS: 29 days   SUBJECTIVE:       Dr Arnette Norris request GI to reconsider placement of PEG. Initial evaluation documented 2/5.  IR reluctant to place PEG given EGD in [place] showing gastritis and ventral hernia by exam and on CT scan 08/22/13.  Pt has copious purulent secretions, repeated sputum cultures growing pseudomonas (latest 2/20).  Had also grown MRSA in past.  Off vent but per CCM will require long term trach, no plans to decannulate.   He is NPO, not felt to be safe for POs.   Do not see any formal swallow evaluations in chart, only see vocal speaking valve notes.  C/o being thirsty. Denies nausea, abdominal pain.   OBJECTIVE:         Vital signs    BP 142/78  Temp 98.4  Pulse 81  resp 20  sats 93%.  No recorded weights in archived documents.   General: contracted,  Chronically unwell  apearing WM who is comfortable. Neck:  Thick tan secretions spewing from the trach opening.  Do not smell like tube feeds Heart: RRR Chest: ronchorous, wet cough Abdomen: soft, active BS, ND.  Soft, reducible, softball sized hernia just superior to umbilicus.   Extremities: no CCE Derm:  Previous rash in groin is resolved.  Neuro/Psych:  Alert, follows commands.  Moves limbs.  No tremor.  Relaxed and appropriate  Meds:  Jevity 1.5 At 60 ml/hour.  Fentanyl Patch  Novolog , Levemir insulin  Atorvastatin  Clonazepam  Digoxin  Torsemide Scopalamine patch combivent inhaler  Levothyroxine  Metoprolol  Renal MVI  Olanzapine  Protonix 40 mg VT BID  Rivastigmine Kayexelate  Vitamin D  Cefepime.   Lab Results:  Recent Labs  09/11/13 0545  WBC 19.6*  HGB 8.7*  HCT 27.3*  PLT 561*  MCV    92  BMET  Recent Labs  09/11/13 0545  NA 139  K 4.1  CL 97  CO2 28  GLUCOSE 169*  BUN 29*  CREATININE 0.46*  CALCIUM 8.9   LFT No results found for this basename: PROT, ALBUMIN, AST,  ALT, ALKPHOS, BILITOT, BILIDIR, IBILI,  in the last 72 hours PT/INR  Recent Labs  09/11/13 0500  LABPROT 16.8*  INR 1.40    Studies/Results: US Renal 09/10/2013   CLINICAL DATA:  History of diabetes and hypertension  EXAM: RENAL/URINARY TRACT ULTRASOUND COMPLETE  COMPARISON:  CT ABD/PELVIS W CM dated 08/22/2013  FINDINGS: Right Kidney:  Length: 12 cm The echotexture of the renal cortex is slightly greater than that of the adjacent liver. There is no hydronephrosis. There is a 1.4 x 1.7 x 1 cm diameter cyst in the lower pole of the right kidney.  Left Kidney:  Length: 12.5 cm. The renal cortical echotexture appears mildly increased. There is a lateral lower pole exophytic cystic structure measuring 3.1 x 2.5 x 2.4 cm.  Bladder:  The urinary bladder is partially distended and contains a Foley catheter.  IMPRESSION: 1. The echotexture of both kidneys is mildly increased which may reflect medical renal disease. There are simple appearing cysts in both kidneys. 2. There is no evidence of hydronephrosis.   Electronically Signed   By: David  [place]   On: 09/10/2013 11:23   Dg Chest Port 1 View  09/07/2013   FINDINGS: Tracheostomy, left IJ central line, and NG tube are stable in position. Similar right base loculated hydro pneumothorax laterally,  without change. Diffuse bilateral asymmetric airspace process throughout the perihilar regions and both lung bases. No significant interval change. Small left effusion not excluded. IMPRESSION: Stable persistent right base loculated hydro pneumothorax No change in the diffuse asymmetric airspace process versus edema. Electronically Signed By: Ruel Favorsrevor Shick M.D. On: 09/07/2013 07:47   CT ABDOMEN AND PELVIS WITH CONTRAST 08/22/2013 IMPRESSION:  1. Decreased size of the patient's right hydro pneumothorax  consistent with interval chest tube placement.  2. Consolidative infiltrates are appreciated within the lung bases  with components of cystic bronchiectasis and  cavitation, raising  concern of a destructive component of the infiltrates. Etiology such  as tuberculosis, Pseudomonas, and Staphylococcus, this contrast are  diagnostic consideration. When compared to the previous study the  visualized portions appears stable.  3. Increased size of the patient's left pleural effusion  4. Small amount of perihepatic ascites.  5. Otherwise no evidence of obstructive or inflammatory  abnormalities within the abdomen  6. Fat containing lower ventral abdominal wall hernia  7. Likely small cysts within the left kidney these findings of may  possibly be further characterized with dedicated renal ultrasound.     ASSESMENT:   *  Dysphagia.  Multifactorial. Tolerating tube feeds  *  Ventral hernia of lower abdominal wall containing fat, CT scan 08/22/13.  On exam the hernia is superior to the umbilicus.  May prevent endoscopic placement of G tube.   *  Gastritis on EGD (for hx dark emesis) per notes from [place].   *  Protein malnutrition.   *  Pseudomonas and MRSA PNA.  Resp failure. Tension pneumothorax, chest tube 1/29 - 2/11.  On trach collar with copious secretions. No plans to decannulate per CCM. WBCs elevated but stable.   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 Improved overall.   *  Renal insufficiency.   *  Transfusion requiring anemia.  Normocytic.  (2/2, 2/3: 4 units total).    *  Schizophrenia, Lewey-body dementia.  Currently not agitated.   *  DM 2.  On insulin.    PLAN   *  Dr Russella DarStark to evaluate later today for consideration of PEG. Cefepime should suffice for preo-op abx and no heparin or other thinners to hold.    Jennye MoccasinSarah Gribbin  09/11/2013, 9:08 AM Pager: [phone]     Attending physician's note   I have taken an interval history, reviewed the chart and examined the patient. I agree with the Advanced Practitioner's note, impression and recommendations. Pt felt to be unsafe  for PO feedings and PEG is requested. Pt is medically stable for PEG placement. His ventral hernia may preclude PEG placement. If he has significant gastritis or other findings, they also may preclude PEG placement. Scheduled for PEG tomorrow at 1300.   Venita LickMalcolm T. Russella DarStark, MD [facility]

## 2013-09-11 DIAGNOSIS — J189 Pneumonia, unspecified organism: Secondary | ICD-10-CM

## 2013-09-11 LAB — MPO/PR-3 (ANCA) ANTIBODIES

## 2013-09-11 LAB — CBC
HCT: 27.3 % — ABNORMAL LOW (ref 39.0–52.0)
Hemoglobin: 8.7 g/dL — ABNORMAL LOW (ref 13.0–17.0)
MCH: 29.5 pg (ref 26.0–34.0)
MCHC: 31.9 g/dL (ref 30.0–36.0)
MCV: 92.5 fL (ref 78.0–100.0)
PLATELETS: 561 10*3/uL — AB (ref 150–400)
RBC: 2.95 MIL/uL — AB (ref 4.22–5.81)
RDW: 21 % — ABNORMAL HIGH (ref 11.5–15.5)
WBC: 19.6 10*3/uL — ABNORMAL HIGH (ref 4.0–10.5)

## 2013-09-11 LAB — BASIC METABOLIC PANEL
BUN: 29 mg/dL — ABNORMAL HIGH (ref 6–23)
CALCIUM: 8.9 mg/dL (ref 8.4–10.5)
CO2: 28 mEq/L (ref 19–32)
Chloride: 97 mEq/L (ref 96–112)
Creatinine, Ser: 0.46 mg/dL — ABNORMAL LOW (ref 0.50–1.35)
GLUCOSE: 169 mg/dL — AB (ref 70–99)
POTASSIUM: 4.1 meq/L (ref 3.7–5.3)
SODIUM: 139 meq/L (ref 137–147)

## 2013-09-11 LAB — PROTIME-INR
INR: 1.4 (ref 0.00–1.49)
PROTHROMBIN TIME: 16.8 s — AB (ref 11.6–15.2)

## 2013-09-12 ENCOUNTER — Encounter
Admission: AD | Disposition: A | Discharge status: Skilled Nursing Facility | Payer: Self-pay | Source: Ambulatory Visit | Attending: Internal Medicine

## 2013-09-12 DIAGNOSIS — J9600 Acute respiratory failure, unspecified whether with hypoxia or hypercapnia: Secondary | ICD-10-CM | POA: Diagnosis not present

## 2013-09-12 DIAGNOSIS — Z930 Tracheostomy status: Secondary | ICD-10-CM | POA: Diagnosis not present

## 2013-09-12 HISTORY — PX: PEG PLACEMENT: SHX5437

## 2013-09-12 HISTORY — PX: ESOPHAGOGASTRODUODENOSCOPY: SHX5428

## 2013-09-12 LAB — ANA: ANA: NEGATIVE

## 2013-09-12 SURGERY — INSERTION, PEG TUBE
Anesthesia: Moderate Sedation

## 2013-09-12 MED ORDER — FENTANYL CITRATE 0.05 MG/ML IJ SOLN
INTRAMUSCULAR | Status: DC | PRN
Start: 2013-09-12 — End: 2013-09-12
  Administered 2013-09-12: 12.5 ug via INTRAVENOUS
  Administered 2013-09-12: 25 ug via INTRAVENOUS
  Administered 2013-09-12: 12.5 ug via INTRAVENOUS

## 2013-09-12 MED ORDER — FENTANYL CITRATE 0.05 MG/ML IJ SOLN
INTRAMUSCULAR | Status: AC
  Filled 2013-09-12: qty 4

## 2013-09-12 MED ORDER — MIDAZOLAM HCL 5 MG/ML IJ SOLN
INTRAMUSCULAR | Status: AC
  Filled 2013-09-12: qty 2

## 2013-09-12 MED ORDER — SODIUM CHLORIDE 0.9 % IV SOLN
INTRAVENOUS | Status: DC
  Administered 2013-09-12: 500 mL via INTRAVENOUS

## 2013-09-12 MED ORDER — MIDAZOLAM HCL 10 MG/2ML IJ SOLN
INTRAMUSCULAR | Status: DC | PRN
  Administered 2013-09-12 (×2): 2 mg via INTRAVENOUS

## 2013-09-12 NOTE — Op Note (Signed)
[facility] [address] [place], [id number]   OPERATIVE PROCEDURE REPORT PATIENT: Lolly MustacheDaffron, Jaeson  MR#: [id number] BIRTHDATE :Sep 03, 1950 , 62  yrs. old GENDER: Male ENDOSCOPIST: Meryl DareMalcolm T Lyrika Souders, MD, [facility] PROCEDURE DATE:  09/12/2013 PROCEDURE:   EGD with PEG placement ASA CLASS:   Class IV INDICATIONS:1.  dysphagia.   2.  placement of PEG.   3.  feeding difficulties. MEDICATIONS: medications were titrated to patient response per physician's verbal order, Fentanyl 50 mcg IV, and Versed 4 mg IV TOPICAL ANESTHETIC: none DESCRIPTION OF PROCEDURE:  After the risks benefits and alternatives of the procedure were thoroughly explained, informed consent was obtained.  The EG-[id number] ([id number]  endoscope was introduced through the mouth  and advanced to the descending duodenum ,      The instrument was slowly withdrawn as the mucosa was fully examined. STOMACH: Mild gastritis (inflammation) was found in the gastric body.  The stomach otherwise appeared normal.  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 ESOPHAGUS: The mucosa of the esophagus appeared normal. DUODENUM: The duodenal mucosa showed no abnormalities in the bulb and second portion of the duodenum.  The stomach was then inflated with air, and by a combination of transillumination and manual palpation, the site for the gastrostomy tube placement was selected and marked on the anterior abdominal wall.  The skin of the anterior abdomen was surgically prepped and draped with sterile drapes. Utilizing strict sterile technique, the selected site was then anesthetized with 1% xylocaine by injection into the skin and subcutaneous tissue.  A 1 cm incision was made through the skin and subcutaneous tissue, and the needle/cannula assembly was then passed through the abdominal wall and through the anterior wall of the body of the stomach, maintaining visualization with  the endoscope.  A snare device previously placed through the instrument channel was then opened and placed around the cannula, the needle was removed, and the insertion wire was passed through the cannula and into the stomach lumen.  The snare was then loosened from the cannula, and repositioned to snare the insertion wire.  The snare was then pulled up to the endoscope distal tip, and the scope was then withdrawn bringing with it the snare and insertion wire. The insertion wire was then released from the snare, and then loop-attached to the [employer] Pull PEG. The G-tube insertion site was then cleansed once again, and the external bolster was placed over the tube to secure it to the abdominal wall.  A sterile dressing was then applied, and the procedure terminated. COMPLICATIONS: There were no complications. ENDOSCOPIC IMPRESSION: 1.   Mild gastritis in the gastric body; 24 fr PEG placed without difficulty 2.   Hiatus hernia in the cardia RECOMMENDATIONS: 1.  Begin PEG feedings tomorrow  eSigned:  Meryl DareMalcolm T Steffi Noviello, MD, [facility] 09/12/2013 1:47 PM

## 2013-09-12 NOTE — Progress Notes (Signed)
    Daily Rounding Note   LOS: 30 days    SUBJECTIVE:  Stable overnight.    OBJECTIVE:  Pt resting quietly and not examined.   CBC    Component Value Date/Time   WBC 19.6* 09/11/2013 0545   RBC 2.95* 09/11/2013 0545   HGB 8.7* 09/11/2013 0545   HCT 27.3* 09/11/2013 0545   PLT 561* 09/11/2013 0545   MCV 92.5 09/11/2013 0545   MCH 29.5 09/11/2013 0545   MCHC 31.9 09/11/2013 0545   RDW 21.0* 09/11/2013 0545   BMET    Component Value Date/Time   NA 139 09/11/2013 0545   K 4.1 09/11/2013 0545   CL 97 09/11/2013 0545   CO2 28 09/11/2013 0545   GLUCOSE 169* 09/11/2013 0545   BUN 29* 09/11/2013 0545   CREATININE 0.46* 09/11/2013 0545   CALCIUM 8.9 09/11/2013 0545   GFRNONAA >90 09/11/2013 0545   GFRAA >90 09/11/2013 0545     ASSESMENT:    * Dysphagia. Multifactorial. Tolerating tube feeds.  * Ventral hernia of lower abdominal wall containing fat, CT scan 08/22/13. On exam the hernia is superior to the umbilicus. May prevent endoscopic placement of G tube.  * Gastritis on EGD (for hx dark emesis) per [place] from [place].  * Protein malnutrition.  * Pseudomonas and MRSA PNA. Resp failure. Tension pneumothorax, chest tube 1/29 - 2/11. On trach collar with copious secretions. No plans to decannulate per CCM. WBCs elevated but stable.  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 Improved overall.  * Renal insufficiency.  * Transfusion requiring anemia. Normocytic. (2/2, 2/3: 4 units total).  * Schizophrenia, Lewey-body dementia. Currently not agitated.  * DM 2. On insulin.    PLAN    * PEG midday.  Cefepime should suffice for preo-op abx and no heparin or other thinners to hold.    Jennye MoccasinSarah Gribbin 09/11/2013, 9:08 AM  Pager: [phone]    Attending physician's note   I agree with the Advanced Practitioner's note, impression and recommendations. For PEG today as planned.  Venita LickMalcolm T. Russella DarStark, MD [facility]

## 2013-09-12 NOTE — Progress Notes (Signed)
PULMONARY  / CRITICAL CARE MEDICINE  Name: Ralph MustacheLarry Rowe MRN: [id number] DOB: 1950/10/09  CONSULTATION DATE: 08/14/2013  BRIEF PATIENT DESCRIPTION: 63 yo with COPD and CHF [facility] with Pseud[facility] / MRSA pneumonia requiring intubation. 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 Unable to wean, [facility] Since [facility] developed tension pneumothorax, requiring tube thoracotomy on 1/29, which was removed 2/11.  ANTIBIOTICS:  Reviewed    INTERVAL HISTORY:  On trach collar x 7 days, Low grade fever +, Lot of secretions. Weight up   PHYSICAL EXAMINATION:  Vital signs: T 98/5, P 76, RR 18, BP 123/63, pulse ox 97%  General:  Appears chronically ill, on trch collar Neuro: Wide open eyes , but arouses to stimulation and does not follow commands HEENT:  PERRL, OETT / OGT Cardiovascular:  RRR, no m/r/g Lungs:  Bilateral air entry, rhonchi scattered Abdomen:  Soft, nontender, bowel sounds diminished Musculoskeletal:  Anasarca Skin:  No rash  LABS:  Recent Labs Lab 09/07/13 0600 09/11/13 0545  NA 129* 139  K 4.8 4.1  CL 94* 97  CO2 28 28  BUN 13 29*  CREATININE 0.37* 0.46*  GLUCOSE 157* 169*    Recent Labs Lab 09/07/13 0600 09/11/13 0545  HGB 8.6* 8.7*  HCT 26.7* 27.3*  WBC 18.7* 19.6*  PLT 389 561*   IMAGING: No results found. 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 Trach collar as tolerated and PVF. No indications for thoracentesis at this time. Antibiotics per primary team. Needs a trach SNF placement at this point, no decannulation.  PCCM will sign off, please call back  if needed.  Alyson ReedyWesam G. Yacoub, M.D. [facility]. Pager: [phone]. After hours pager: [phone].

## 2013-09-12 NOTE — H&P (View-Only) (Signed)
Daily Rounding Note  09/11/2013, 9:08 AM  LOS: 29 days   SUBJECTIVE:       Dr Ralph Rowe request GI to reconsider placement of PEG. Initial evaluation documented 2/5.  IR reluctant to place PEG given EGD in [place] showing gastritis and ventral hernia by exam and on CT scan 08/22/13.  Pt has copious purulent secretions, repeated sputum cultures growing pseudomonas (latest 2/20).  Had also grown MRSA in past.  Off vent but per CCM will require long term trach, no plans to decannulate.   He is NPO, not felt to be safe for POs.   Do not see any formal swallow evaluations in chart, only see vocal speaking valve notes.  C/o being thirsty. Denies nausea, abdominal pain.   OBJECTIVE:         Vital signs    BP 142/78  Temp 98.4  Pulse 81  resp 20  sats 93%.  No recorded weights in archived documents.   General: contracted,  Chronically unwell  apearing WM who is comfortable. Neck:  Thick tan secretions spewing from the trach opening.  Do not smell like tube feeds Heart: RRR Chest: ronchorous, wet cough Abdomen: soft, active BS, ND.  Soft, reducible, softball sized hernia just superior to umbilicus.   Extremities: no CCE Derm:  Previous rash in groin is resolved.  Neuro/Psych:  Alert, follows commands.  Moves limbs.  No tremor.  Relaxed and appropriate  Meds:  Jevity 1.5 At 60 ml/hour.  Fentanyl Patch  Novolog , Levemir insulin  Atorvastatin  Clonazepam  Digoxin  Torsemide Scopalamine patch combivent inhaler  Levothyroxine  Metoprolol  Renal MVI  Olanzapine  Protonix 40 mg VT BID  Rivastigmine Kayexelate  Vitamin D  Cefepime.   Lab Results:  Recent Labs  09/11/13 0545  WBC 19.6*  HGB 8.7*  HCT 27.3*  PLT 561*  MCV    92  BMET  Recent Labs  09/11/13 0545  NA 139  K 4.1  CL 97  CO2 28  GLUCOSE 169*  BUN 29*  CREATININE 0.46*  CALCIUM 8.9   LFT No results found for this basename: PROT, ALBUMIN, AST,  ALT, ALKPHOS, BILITOT, BILIDIR, IBILI,  in the last 72 hours PT/INR  Recent Labs  09/11/13 0500  LABPROT 16.8*  INR 1.40    Studies/Results: US Renal 09/10/2013   CLINICAL DATA:  History of diabetes and hypertension  EXAM: RENAL/URINARY TRACT ULTRASOUND COMPLETE  COMPARISON:  CT ABD/PELVIS W CM dated 08/22/2013  FINDINGS: Right Kidney:  Length: 12 cm The echotexture of the renal cortex is slightly greater than that of the adjacent liver. There is no hydronephrosis. There is a 1.4 x 1.7 x 1 cm diameter cyst in the lower pole of the right kidney.  Left Kidney:  Length: 12.5 cm. The renal cortical echotexture appears mildly increased. There is a lateral lower pole exophytic cystic structure measuring 3.1 x 2.5 x 2.4 cm.  Bladder:  The urinary bladder is partially distended and contains a Foley catheter.  IMPRESSION: 1. The echotexture of both kidneys is mildly increased which may reflect medical renal disease. There are simple appearing cysts in both kidneys. 2. There is no evidence of hydronephrosis.   Electronically Signed   By: Ralph  Rowe   On: 09/10/2013 11:23   Dg Chest Port 1 View  09/07/2013   FINDINGS: Tracheostomy, left IJ central line, and NG tube are stable in position. Similar right base loculated hydro pneumothorax laterally,  without change. Diffuse bilateral asymmetric airspace process throughout the perihilar regions and both lung bases. No significant interval change. Small left effusion not excluded. IMPRESSION: Stable persistent right base loculated hydro pneumothorax No change in the diffuse asymmetric airspace process versus edema. Electronically Signed By: Ralph Rowe M.D. On: 09/07/2013 07:47   CT ABDOMEN AND PELVIS WITH CONTRAST 08/22/2013 IMPRESSION:  1. Decreased size of the patient's right hydro pneumothorax  consistent with interval chest tube placement.  2. Consolidative infiltrates are appreciated within the lung bases  with components of cystic bronchiectasis and  cavitation, raising  concern of a destructive component of the infiltrates. Etiology such  as tuberculosis, Pseudomonas, and Staphylococcus, this contrast are  diagnostic consideration. When compared to the previous study the  visualized portions appears stable.  3. Increased size of the patient's left pleural effusion  4. Small amount of perihepatic ascites.  5. Otherwise no evidence of obstructive or inflammatory  abnormalities within the abdomen  6. Fat containing lower ventral abdominal wall hernia  7. Likely small cysts within the left kidney these findings of may  possibly be further characterized with dedicated renal ultrasound.     ASSESMENT:   *  Dysphagia.  Multifactorial. Tolerating tube feeds  *  Ventral hernia of lower abdominal wall containing fat, CT scan 08/22/13.  On exam the hernia is superior to the umbilicus.  May prevent endoscopic placement of G tube.   *  Gastritis on EGD (for hx dark emesis) per notes from [place].   *  Protein malnutrition.   *  Pseudomonas and MRSA PNA.  Resp failure. Tension pneumothorax, chest tube 1/29 - 2/11.  On trach collar with copious secretions. No plans to decannulate per CCM. WBCs elevated but stable.   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 Improved overall.   *  Renal insufficiency.   *  Transfusion requiring anemia.  Normocytic.  (2/2, 2/3: 4 units total).    *  Schizophrenia, Lewey-body dementia.  Currently not agitated.   *  DM 2.  On insulin.    PLAN   *  Dr Ralph Rowe to evaluate later today for consideration of PEG. Cefepime should suffice for preo-op abx and no heparin or other thinners to hold.    Ralph MoccasinSarah Rowe  09/11/2013, 9:08 AM Pager: [phone]     Attending physician's note   I have taken an interval history, reviewed the chart and examined the patient. I agree with the Advanced Practitioner's note, impression and recommendations. Pt felt to be unsafe  for PO feedings and PEG is requested. Pt is medically stable for PEG placement. His ventral hernia may preclude PEG placement. If he has significant gastritis or other findings, they also may preclude PEG placement. Scheduled for PEG tomorrow at 1300.   Venita LickMalcolm T. Ralph DarStark, MD [facility]

## 2013-09-12 NOTE — Interval H&P Note (Signed)
History and Physical Interval Note:  09/12/2013 12:51 PM  Ralph Rowe  has presented today for surgery, with the diagnosis of dysphagia.  The various methods of treatment have been discussed with the patient and family. After consideration of risks, benefits and other options for treatment, the patient has consented to  Procedure(s): PERCUTANEOUS ENDOSCOPIC GASTROSTOMY (PEG) PLACEMENT (N/A) ESOPHAGOGASTRODUODENOSCOPY (EGD) (N/A) as a surgical intervention .  The patient's history has been reviewed, patient examined, no change in status, stable for surgery.  I have reviewed the patient's chart and labs.  Questions were answered to the patient's satisfaction.     Venita LickMalcolm T. Russella DarStark MD

## 2013-09-13 ENCOUNTER — Encounter (HOSPITAL_COMMUNITY): Payer: Self-pay | Admitting: Gastroenterology

## 2013-09-13 NOTE — Progress Notes (Signed)
Subjective: No acute events.  Objective: Vital signs in last 24 hours: Temp:  [98.2 F (36.8 C)-98.8 F (37.1 C)] 98.2 F (36.8 C) (02/27 1347) Pulse Rate:  [75-82] 78 (02/27 1400) Resp:  [15-39] 25 (02/27 1400) BP: (142-178)/(73-84) 144/84 mmHg (02/27 1400) SpO2:  [91 %-99 %] 95 % (02/27 1400)    Intake/Output from previous day:   Intake/Output this shift:    General appearance: alert GI: PEG site is Clean/Dry/Intact  Lab Results:  Recent Labs  09/11/13 0545  WBC 19.6*  HGB 8.7*  HCT 27.3*  PLT 561*   BMET  Recent Labs  09/11/13 0545  NA 139  K 4.1  CL 97  CO2 28  GLUCOSE 169*  BUN 29*  CREATININE 0.46*  CALCIUM 8.9   LFT No results found for this basename: PROT, ALBUMIN, AST, ALT, ALKPHOS, BILITOT, BILIDIR, IBILI,  in the last 72 hours PT/INR  Recent Labs  09/11/13 0500  LABPROT 16.8*  INR 1.40   Hepatitis Panel No results found for this basename: HEPBSAG, HCVAB, HEPAIGM, HEPBIGM,  in the last 72 hours C-Diff No results found for this basename: CDIFFTOX,  in the last 72 hours Fecal Lactopherrin No results found for this basename: FECLLACTOFRN,  in the last 72 hours  Studies/Results: No results found.  Medications:  Scheduled: . phytonadione  5 mg Subcutaneous Once   Continuous: . sodium chloride 500 mL (09/12/13 1313)    Assessment/Plan: 1) Feeding difficulties. 2) S/p PEG tube.   PEG tube is functional.  No overt complications.    Plan: 1) Okay to use PEG tube.   LOS: 31 days   Warden Buffa D 09/13/2013, 7:19 AM

## 2013-09-14 LAB — BASIC METABOLIC PANEL
BUN: 20 mg/dL (ref 6–23)
CALCIUM: 9.3 mg/dL (ref 8.4–10.5)
CO2: 26 mEq/L (ref 19–32)
Chloride: 106 mEq/L (ref 96–112)
Creatinine, Ser: 0.4 mg/dL — ABNORMAL LOW (ref 0.50–1.35)
GFR calc Af Amer: >90 mL/min (ref >90)
GFR calc non Af Amer: >90 mL/min (ref >90)
Glucose, Bld: 155 mg/dL — ABNORMAL HIGH (ref 70–99)
Potassium: 4.3 mEq/L (ref 3.7–5.3)
Sodium: 143 mEq/L (ref 137–147)

## 2013-09-15 LAB — BASIC METABOLIC PANEL
BUN: 26 mg/dL — ABNORMAL HIGH (ref 6–23)
CHLORIDE: 101 meq/L (ref 96–112)
CO2: 28 mEq/L (ref 19–32)
Calcium: 9.3 mg/dL (ref 8.4–10.5)
Creatinine, Ser: 0.53 mg/dL (ref 0.50–1.35)
GLUCOSE: 171 mg/dL — AB (ref 70–99)
Potassium: 4.2 mEq/L (ref 3.7–5.3)
Sodium: 139 mEq/L (ref 137–147)

## 2013-09-15 LAB — PROTEIN ELECTROPHORESIS, SERUM
ALBUMIN ELP: 31.1 % — AB (ref 55.8–66.1)
ALPHA-2-GLOBULIN: 16.4 % — AB (ref 7.1–11.8)
Alpha-1-Globulin: 9.5 % — ABNORMAL HIGH (ref 2.9–4.9)
Beta 2: 8 % — ABNORMAL HIGH (ref 3.2–6.5)
Beta Globulin: 7.1 % (ref 4.7–7.2)
Gamma Globulin: 27.9 % — ABNORMAL HIGH (ref 11.1–18.8)
M-Spike, %: NOT DETECTED g/dL
TOTAL PROTEIN ELP: 7.2 g/dL (ref 6.0–8.3)

## 2013-09-16 LAB — CBC
HEMATOCRIT: 30.5 % — AB (ref 39.0–52.0)
HEMOGLOBIN: 9.4 g/dL — AB (ref 13.0–17.0)
MCH: 28.4 pg (ref 26.0–34.0)
MCHC: 30.8 g/dL (ref 30.0–36.0)
MCV: 92.1 fL (ref 78.0–100.0)
Platelets: 486 10*3/uL — ABNORMAL HIGH (ref 150–400)
RBC: 3.31 MIL/uL — ABNORMAL LOW (ref 4.22–5.81)
RDW: 19.8 % — ABNORMAL HIGH (ref 11.5–15.5)
WBC: 21.8 10*3/uL — AB (ref 4.0–10.5)

## 2013-09-16 LAB — BASIC METABOLIC PANEL
BUN: 40 mg/dL — AB (ref 6–23)
CHLORIDE: 102 meq/L (ref 96–112)
CO2: 31 mEq/L (ref 19–32)
Calcium: 9.1 mg/dL (ref 8.4–10.5)
Creatinine, Ser: 0.59 mg/dL (ref 0.50–1.35)
GFR calc Af Amer: >90 mL/min (ref >90)
GFR calc non Af Amer: >90 mL/min (ref >90)
Glucose, Bld: 213 mg/dL — ABNORMAL HIGH (ref 70–99)
POTASSIUM: 4.4 meq/L (ref 3.7–5.3)
Sodium: 145 mEq/L (ref 137–147)

## 2013-09-17 NOTE — Progress Notes (Signed)
[facility]                                                                                              Progress note     Patient Demographics  Ralph Rowe, is a 63 y.o. male  WUJ:[id number]  NFA:[id number]  DOB - Jun 06, 1951  Admit date - 08/13/2013  Admitting Physician Elnora MorrisonAhmad B Barakat, MD  Outpatient Primary MD for the patient is No PCP Per Patient  LOS - 35   No chief complaint on file.      Subjective:   Ralph Ralph Rowe today looks confused, not able to understand questions. His nurses were concerned.  Objective:   Vital signs  Temperature 98.9 Heart rate 78 Respiratory rate 18 Blood pressure 96/60 Pulse ox 96% Blood sugar: 188-189    Exam Confused No new F.N deficits, flat affect [place].AT  Supple Neck,No JVD, No cervical lymphadenopathy appriciated.  Tracheostomy noted Symmetrical Chest wall movement, Good air movement bilaterally, decreased breath sounds bilaterally RRR,No Gallops,Rubs or new Murmurs, No Parasternal Heave +ve B.Sounds, Abd Soft, Non tender, No organomegaly appriciated, No rebound - guarding or rigidity. PEG tube noted No Cyanosis, Clubbing or edema, No new Rash or bruise     I&Os 2780/2400 Peg Tube yes Foley yes  Trach #6 Shiley  Data Review   CBC  Recent Labs Lab 09/11/13 0545 09/16/13 0610  WBC 19.6* 21.8*  HGB 8.7* 9.4*  HCT 27.3* 30.5*  PLT 561* 486*  MCV 92.5 92.1  MCH 29.5 28.4  MCHC 31.9 30.8  RDW 21.0* 19.8*    Chemistries   Recent Labs Lab 09/11/13 0545 09/14/13 0500 09/15/13 0500 09/16/13 0610  NA 139 143 139 145  K 4.1 4.3 4.2 4.4  CL 97 106 101 102  CO2 28 26 28 31   GLUCOSE 169* 155* 171* 213*  BUN 29* 20 26* 40*  CREATININE 0.46* 0.40* 0.53 0.59  CALCIUM 8.9 9.3 9.3 9.1   Coagulation profile  Recent Labs Lab 09/11/13 0500  INR 1.40       Assessment & Plan    Respiratory failure; continue with ATC  30% Sepsis; resolved Hospital-acquired pneumonia with MRSA and Pseudomonas now on inhaled colistin. Status post multiple antibiotics   courses A. 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

## 2013-09-18 ENCOUNTER — Other Ambulatory Visit (HOSPITAL_COMMUNITY): Payer: Medicaid Other

## 2013-09-18 LAB — BASIC METABOLIC PANEL
BUN: 74 mg/dL — ABNORMAL HIGH (ref 6–23)
CO2: 36 meq/L — AB (ref 19–32)
CREATININE: 1 mg/dL (ref 0.50–1.35)
Calcium: 9.4 mg/dL (ref 8.4–10.5)
Chloride: 99 mEq/L (ref 96–112)
GFR calc Af Amer: >90 mL/min (ref >90)
GFR calc non Af Amer: 79 mL/min — ABNORMAL LOW (ref >90)
GLUCOSE: 117 mg/dL — AB (ref 70–99)
Potassium: 3.5 mEq/L — ABNORMAL LOW (ref 3.7–5.3)
Sodium: 149 mEq/L — ABNORMAL HIGH (ref 137–147)

## 2013-09-18 LAB — CBC
HCT: 30.9 % — ABNORMAL LOW (ref 39.0–52.0)
HEMOGLOBIN: 9.5 g/dL — AB (ref 13.0–17.0)
MCH: 28.6 pg (ref 26.0–34.0)
MCHC: 30.7 g/dL (ref 30.0–36.0)
MCV: 93.1 fL (ref 78.0–100.0)
Platelets: 467 10*3/uL — ABNORMAL HIGH (ref 150–400)
RBC: 3.32 MIL/uL — AB (ref 4.22–5.81)
RDW: 19.7 % — ABNORMAL HIGH (ref 11.5–15.5)
WBC: 24.7 10*3/uL — ABNORMAL HIGH (ref 4.0–10.5)

## 2013-09-18 NOTE — Progress Notes (Signed)
[facility]                                                                                              Progress note     Patient Demographics  Ralph Rowe, is a 63 y.o. male  WUJ:[id number]  NFA:[id number]  DOB - [birth year]  Admit date - 08/13/2013  Admitting Physician Elnora MorrisonAhmad B Barakat, MD  Outpatient Primary MD for the patient is No PCP Per Patient  LOS - 36   No chief complaint on file.      Subjective:   Ralph MustacheLarry Rowe today still looks confused with decrease in his baseline.  Objective:   Vital signs  Temperature 98.6 Heart rate 88  Respiratory rate 18 Blood pressure 98/62 Pulse ox 96% Blood sugar: 190/129    Exam Confused No new F.N deficits, flat affect [place].AT  Supple Neck,No JVD, No cervical lymphadenopathy appriciated.  Tracheostomy noted Symmetrical Chest wall movement, Good air movement bilaterally, decreased breath sounds bilaterally RRR,No Gallops,Rubs or new Murmurs, No Parasternal Heave +ve B.Sounds, Abd Soft, Non tender, No organomegaly appriciated, No rebound - guarding or rigidity. PEG tube noted No Cyanosis, Clubbing or edema, No new Rash or bruise     I&Os 1666/1350 Peg Tube yes Foley yes  Trach #6 Shiley  Data Review   CBC  Recent Labs Lab 09/16/13 0610 09/18/13 0500  WBC 21.8* 24.7*  HGB 9.4* 9.5*  HCT 30.5* 30.9*  PLT 486* 467*  MCV 92.1 93.1  MCH 28.4 28.6  MCHC 30.8 30.7  RDW 19.8* 19.7*    Chemistries   Recent Labs Lab 09/14/13 0500 09/15/13 0500 09/16/13 0610 09/18/13 0500  NA 143 139 145 149*  K 4.3 4.2 4.4 3.5*  CL 106 101 102 99  CO2 26 28 31  36*  GLUCOSE 155* 171* 213* 117*  BUN 20 26* 40* 74*  CREATININE 0.40* 0.53 0.59 1.00  CALCIUM 9.3 9.3 9.1 9.4   Coagulation profile No results found for this basename: INR, PROTIME,  in the last 168 hours     Assessment & Plan    Respiratory failure; continue with  ATC 30%, chest x-ray on 3/5 shows improved aeration Sepsis his white blood cell count is still creeping up and today it is 24. Discussed with ID who will see the patient today.     Continue the patient on inhaled colistin and antibiotics per ID Hospital-acquired pneumonia with MRSA and Pseudomonas now on inhaled colistin. Status post multiple antibiotics   courses A. 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 difficile Start Flagyl IV Discussed with ID and pulmonary critical care Check CBC BMP in a.m.  Code Status: Limited   DVT Prophylaxis  SCDs   Carron CurieHijazi, Teddie Curd M.D on 09/18/2013 at  4:19 PM

## 2013-09-19 ENCOUNTER — Institutional Professional Consult (permissible substitution) (HOSPITAL_COMMUNITY): Payer: Medicaid Other

## 2013-09-19 LAB — BASIC METABOLIC PANEL
BUN: 89 mg/dL — ABNORMAL HIGH (ref 6–23)
CALCIUM: 9.1 mg/dL (ref 8.4–10.5)
CO2: 36 meq/L — AB (ref 19–32)
Chloride: 99 mEq/L (ref 96–112)
Creatinine, Ser: 1.07 mg/dL (ref 0.50–1.35)
GFR calc Af Amer: 84 mL/min — ABNORMAL LOW (ref >90)
GFR calc non Af Amer: 72 mL/min — ABNORMAL LOW (ref >90)
Glucose, Bld: 321 mg/dL — ABNORMAL HIGH (ref 70–99)
Potassium: 3.6 mEq/L — ABNORMAL LOW (ref 3.7–5.3)
Sodium: 149 mEq/L — ABNORMAL HIGH (ref 137–147)

## 2013-09-19 LAB — CBC
HCT: 30.3 % — ABNORMAL LOW (ref 39.0–52.0)
Hemoglobin: 9.3 g/dL — ABNORMAL LOW (ref 13.0–17.0)
MCH: 28.9 pg (ref 26.0–34.0)
MCHC: 30.7 g/dL (ref 30.0–36.0)
MCV: 94.1 fL (ref 78.0–100.0)
PLATELETS: 439 10*3/uL — AB (ref 150–400)
RBC: 3.22 MIL/uL — AB (ref 4.22–5.81)
RDW: 19.7 % — AB (ref 11.5–15.5)
WBC: 23.8 10*3/uL — ABNORMAL HIGH (ref 4.0–10.5)

## 2013-09-19 NOTE — Progress Notes (Signed)
[facility]                                                                                              Progress note     Patient Demographics  Ralph Rowe, is a 63 y.o. male  [id number]  [id number]:[id number]  DOB - 1950/12/23  Admit date - 08/13/2013  Admitting Physician Elnora MorrisonAhmad B Barakat, MD  Outpatient Primary MD for the patient is No PCP Per Patient  LOS - 37   CC    Respiratory failure    Right hydropneumothorax     Subjective:   Ralph MustacheLarry [place] today still looks confused  Objective:   Vital signs  Temperature 98.0 Heart rate 74 Respiratory rate 22 Blood pressure 119/70 Pulse ox 99%  Exam Confused No new F.N deficits, flat affect [place].AT  Supple Neck,No JVD, No cervical lymphadenopathy appriciated.  Tracheostomy noted Symmetrical Chest wall movement, Good air movement bilaterally, decreased breath sounds bilaterally RRR,No Gallops,Rubs or new Murmurs, No Parasternal Heave +ve B.Sounds, Abd Soft, Non tender, No organomegaly appriciated, No rebound - guarding or rigidity. PEG tube noted No Cyanosis, Clubbing or edema, No new Rash or bruise     I&Os 2256/1456 Peg Tube yes Foley yes  Trach #6 Shiley  Data Review   CBC  Recent Labs Lab 09/16/13 0610 09/18/13 0500 09/19/13 0537  WBC 21.8* 24.7* 23.8*  HGB 9.4* 9.5* 9.3*  HCT 30.5* 30.9* 30.3*  PLT 486* 467* 439*  MCV 92.1 93.1 94.1  MCH 28.4 28.6 28.9  MCHC 30.8 30.7 30.7  RDW 19.8* 19.7* 19.7*    Chemistries   Recent Labs Lab 09/14/13 0500 09/15/13 0500 09/16/13 0610 09/18/13 0500 09/19/13 0537  NA 143 139 145 149* 149*  K 4.3 4.2 4.4 3.5* 3.6*  CL 106 101 102 99 99  CO2 26 28 31  36* 36*  GLUCOSE 155* 171* 213* 117* 321*  BUN 20 26* 40* 74* 89*  CREATININE 0.40* 0.53 0.59 1.00 1.07  CALCIUM 9.3 9.3 9.1 9.4 9.1   Coagulation profile No results found for this basename: INR, PROTIME,  in the last 168  hours     Assessment & Plan    Respiratory failure; continue with ATC 30%, chest x-ray on 3/5 shows improved aeration Sepsis his white blood cell count is still creeping up and today it is 24. Discussed with ID who will see the patient today.     Continue the patient on inhaled colistin and antibiotics per ID. Added Flagyl IV and check stool for C. difficile Hospital-acquired pneumonia with MRSA and Pseudomonas now on inhaled colistin. Status post multiple antibiotic  courses A. 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

## 2013-09-20 LAB — BASIC METABOLIC PANEL
BUN: 98 mg/dL — ABNORMAL HIGH (ref 6–23)
CALCIUM: 9.4 mg/dL (ref 8.4–10.5)
CO2: 39 mEq/L — ABNORMAL HIGH (ref 19–32)
Chloride: 106 mEq/L (ref 96–112)
Creatinine, Ser: 1.02 mg/dL (ref 0.50–1.35)
GFR calc Af Amer: 89 mL/min — ABNORMAL LOW (ref >90)
GFR, EST NON AFRICAN AMERICAN: 77 mL/min — AB (ref >90)
Glucose, Bld: 276 mg/dL — ABNORMAL HIGH (ref 70–99)
Potassium: 4.2 mEq/L (ref 3.7–5.3)
SODIUM: 156 meq/L — AB (ref 137–147)

## 2013-09-20 NOTE — Progress Notes (Signed)
[facility]                                                                                              Progress note     Patient Demographics  Ralph Rowe, is a 63 y.o. male  ZOX:[id number]  [id number]  DOB - February 23, 1951  Admit date - 08/13/2013  Admitting Physician Elnora Morrison, MD  Outpatient Primary MD for the patient is No PCP Per Patient  LOS - 38   CC    Respiratory failure    Right hydropneumothorax     Subjective:   Ralph Rowe today still looks confused  Objective:   Vital signs  Temperature 97.6 Heart rate 74 Respiratory rate 19 Blood pressure 119/77 Pulse ox 100%  Exam Confused No new F.N deficits, flat affect [place].AT  Supple Neck,No JVD, No cervical lymphadenopathy appriciated.  Tracheostomy noted Symmetrical Chest wall movement, Good air movement bilaterally, decreased breath sounds bilaterally RRR,No Gallops,Rubs or new Murmurs, No Parasternal Heave +ve B.Sounds, Abd Soft, Non tender, No organomegaly appriciated, No rebound - guarding or rigidity. PEG tube noted No Cyanosis, Clubbing or edema, No new Rash or bruise     I&Os 2256/2050 Peg Tube yes Foley yes  Trach #6 Shiley  Data Review   CBC  Recent Labs Lab 09/16/13 0610 09/18/13 0500 09/19/13 0537  WBC 21.8* 24.7* 23.8*  HGB 9.4* 9.5* 9.3*  HCT 30.5* 30.9* 30.3*  PLT 486* 467* 439*  MCV 92.1 93.1 94.1  MCH 28.4 28.6 28.9  MCHC 30.8 30.7 30.7  RDW 19.8* 19.7* 19.7*    Chemistries   Recent Labs Lab 09/15/13 0500 09/16/13 0610 09/18/13 0500 09/19/13 0537 09/20/13 0600  NA 139 145 149* 149* 156*  K 4.2 4.4 3.5* 3.6* 4.2  CL 101 102 99 99 106  CO2 28 31 36* 36* 39*  GLUCOSE 171* 213* 117* 321* 276*  BUN 26* 40* 74* 89* 98*  CREATININE 0.53 0.59 1.00 1.07 1.02  CALCIUM 9.3 9.1 9.4 9.1 9.4   Coagulation profile No results found for this basename: INR, PROTIME,  in the last  168 hours     Assessment & Plan    Respiratory failure; continue with ATC 30%, chest x-ray on 3/5 shows improved aeration Sepsis his white blood cell count is still creeping up and today it is 24. Discussed with ID who will see the patient today.     Continue the patient on inhaled colistin and antibiotics per ID. Added Flagyl IV and check stool for C. difficile Hospital-acquired pneumonia with MRSA and Pseudomonas now on inhaled colistin. Status post multiple antibiotic  courses A. fib with RVR, status post echocardiogram showing ejection fraction of 70% Right cerebellar infarct continue with aspirin and Lipitor Gastritis/esophagitis status post EGD 07/2013 Protein calorie malnutrition continue with Glucerna 1.5 per tube Generalized weakness continue with PT/OT Demand ischemia Louie body dementia; continue with Rivastigmine  paranoid schizophrenia Hypothyroidism continue with Synthroid Diabetes mellitus;  Hydro-pneumothorax status post placement and removal of chest tubes Critical care time 32 minutes  Code Status: Limited   DVT Prophylaxis  SCDs   Carron Curie  M.D on 09/20/2013 at 3:22 PM

## 2013-09-21 LAB — BASIC METABOLIC PANEL
BUN: 96 mg/dL — AB (ref 6–23)
CALCIUM: 9.1 mg/dL (ref 8.4–10.5)
CO2: 37 mEq/L — ABNORMAL HIGH (ref 19–32)
CREATININE: 0.94 mg/dL (ref 0.50–1.35)
Chloride: 101 mEq/L (ref 96–112)
GFR, EST NON AFRICAN AMERICAN: 88 mL/min — AB (ref >90)
Glucose, Bld: 294 mg/dL — ABNORMAL HIGH (ref 70–99)
POTASSIUM: 4.3 meq/L (ref 3.7–5.3)
Sodium: 150 mEq/L — ABNORMAL HIGH (ref 137–147)

## 2013-09-21 NOTE — Progress Notes (Signed)
[facility]                                                                                              Progress note     Patient Demographics  Ralph Rowe, is a 63 y.o. male  VWU:[id number]  [id number]:[id number]  DOB - 05-31-1951  Admit date - 08/13/2013  Admitting Physician Elnora Morrison, MD  Outpatient Primary MD for the patient is No PCP Per Patient  LOS - 39   CC    Respiratory failure    Right hydropneumothorax     Subjective:   [name] today still looks confused  Objective:   Vital signs  Temperature 97.6 Heart rate 74 Respiratory rate 19 Blood pressure 119/77 Pulse ox 100%  Exam Confused No new F.N deficits, flat affect [place].AT  Supple Neck,No JVD, No cervical lymphadenopathy appriciated.  Tracheostomy noted Symmetrical Chest wall movement, Good air movement bilaterally, decreased breath sounds bilaterally RRR,No Gallops,Rubs or new Murmurs, No Parasternal Heave +ve B.Sounds, Abd Soft, Non tender, No organomegaly appriciated, No rebound - guarding or rigidity. PEG tube noted No Cyanosis, Clubbing or edema, No new Rash or bruise     I&Os 2256/2050 Peg Tube yes Foley yes  Trach #6 Shiley  Data Review   CBC  Recent Labs Lab 09/16/13 0610 09/18/13 0500 09/19/13 0537  WBC 21.8* 24.7* 23.8*  HGB 9.4* 9.5* 9.3*  HCT 30.5* 30.9* 30.3*  PLT 486* 467* 439*  MCV 92.1 93.1 94.1  MCH 28.4 28.6 28.9  MCHC 30.8 30.7 30.7  RDW 19.8* 19.7* 19.7*    Chemistries   Recent Labs Lab 09/16/13 0610 09/18/13 0500 09/19/13 0537 09/20/13 0600 09/21/13 0600  NA 145 149* 149* 156* 150*  K 4.4 3.5* 3.6* 4.2 4.3  CL 102 99 99 106 101  CO2 31 36* 36* 39* 37*  GLUCOSE 213* 117* 321* 276* 294*  BUN 40* 74* 89* 98* 96*  CREATININE 0.59 1.00 1.07 1.02 0.94  CALCIUM 9.1 9.4 9.1 9.4 9.1   Coagulation profile No results found for this basename: INR, PROTIME,  in the last  168 hours  Assessment & Plan    Respiratory failure; continue with ATC 30%, chest x-ray on 3/5 shows improved aeration Sepsis his white blood cell count is elevated. Discussed with ID who will see the patient today.     Continue the patient on inhaled colistin and antibiotics per ID. Added Flagyl IV and check stool for C. difficile Hospital-acquired pneumonia with MRSA and Pseudomonas now on inhaled colistin. Status post multiple antibiotic  courses A. fib with RVR, status post echocardiogram showing ejection fraction of 70% Right cerebellar infarct continue with aspirin and Lipitor Gastritis/esophagitis status post EGD 07/2013 Protein calorie malnutrition continue with Glucerna 1.5 per tube Generalized weakness continue with PT/OT Demand ischemia Louie body dementia; continue with Rivastigmine  paranoid schizophrenia Hypothyroidism continue with Synthroid Diabetes mellitus; blood sugars are elevated probably due to to D5 water IV Hydro-pneumothorax status post placement and removal of chest tubes Critical care time 33 minutes Plan  check CBC portable chest x-ray in a.m.  Code Status: Limited  DVT Prophylaxis  SCDs   Carron CurieHijazi, Lasasha Brophy M.D on 09/21/2013 at 3:46 PM

## 2013-09-22 ENCOUNTER — Other Ambulatory Visit (HOSPITAL_COMMUNITY): Payer: Medicaid Other

## 2013-09-22 LAB — CBC
HCT: 31.6 % — ABNORMAL LOW (ref 39.0–52.0)
Hemoglobin: 9.5 g/dL — ABNORMAL LOW (ref 13.0–17.0)
MCH: 28.5 pg (ref 26.0–34.0)
MCHC: 30.1 g/dL (ref 30.0–36.0)
MCV: 94.9 fL (ref 78.0–100.0)
Platelets: 407 10*3/uL — ABNORMAL HIGH (ref 150–400)
RBC: 3.33 MIL/uL — ABNORMAL LOW (ref 4.22–5.81)
RDW: 19.3 % — ABNORMAL HIGH (ref 11.5–15.5)
WBC: 23 10*3/uL — ABNORMAL HIGH (ref 4.0–10.5)

## 2013-09-22 LAB — BASIC METABOLIC PANEL
BUN: 95 mg/dL — ABNORMAL HIGH (ref 6–23)
CALCIUM: 8.9 mg/dL (ref 8.4–10.5)
CO2: 36 mEq/L — ABNORMAL HIGH (ref 19–32)
Chloride: 104 mEq/L (ref 96–112)
Creatinine, Ser: 0.98 mg/dL (ref 0.50–1.35)
GFR, EST NON AFRICAN AMERICAN: 86 mL/min — AB (ref >90)
Glucose, Bld: 258 mg/dL — ABNORMAL HIGH (ref 70–99)
Potassium: 4.9 mEq/L (ref 3.7–5.3)
SODIUM: 152 meq/L — AB (ref 137–147)

## 2013-09-25 ENCOUNTER — Other Ambulatory Visit (HOSPITAL_COMMUNITY): Payer: Medicaid Other

## 2013-09-25 LAB — BLOOD GAS, ARTERIAL
ACID-BASE EXCESS: 9.9 mmol/L — AB (ref 0.0–2.0)
Bicarbonate: 34.3 mEq/L — ABNORMAL HIGH (ref 20.0–24.0)
FIO2: 0.5 %
O2 SAT: 94 %
PO2 ART: 70.7 mmHg — AB (ref 80.0–100.0)
Patient temperature: 98.6
TCO2: 35.9 mmol/L (ref 0–100)
pCO2 arterial: 50.1 mmHg — ABNORMAL HIGH (ref 35.0–45.0)
pH, Arterial: 7.45 (ref 7.350–7.450)

## 2013-09-28 LAB — BLOOD GAS, ARTERIAL
ACID-BASE EXCESS: 10.8 mmol/L — AB (ref 0.0–2.0)
Bicarbonate: 34.9 mEq/L — ABNORMAL HIGH (ref 20.0–24.0)
FIO2: 0.3 %
O2 SAT: 95 %
PATIENT TEMPERATURE: 97.7
PH ART: 7.495 — AB (ref 7.350–7.450)
TCO2: 36.3 mmol/L (ref 0–100)
pCO2 arterial: 45.4 mmHg — ABNORMAL HIGH (ref 35.0–45.0)
pO2, Arterial: 68.6 mmHg — ABNORMAL LOW (ref 80.0–100.0)

## 2013-09-28 LAB — CBC
HCT: 30.1 % — ABNORMAL LOW (ref 39.0–52.0)
Hemoglobin: 9.2 g/dL — ABNORMAL LOW (ref 13.0–17.0)
MCH: 28 pg (ref 26.0–34.0)
MCHC: 30.6 g/dL (ref 30.0–36.0)
MCV: 91.8 fL (ref 78.0–100.0)
Platelets: 371 10*3/uL (ref 150–400)
RBC: 3.28 MIL/uL — ABNORMAL LOW (ref 4.22–5.81)
RDW: 18.9 % — ABNORMAL HIGH (ref 11.5–15.5)
WBC: 20.2 10*3/uL — ABNORMAL HIGH (ref 4.0–10.5)

## 2013-09-28 LAB — BASIC METABOLIC PANEL
BUN: 66 mg/dL — ABNORMAL HIGH (ref 6–23)
CO2: 34 meq/L — AB (ref 19–32)
Calcium: 9.1 mg/dL (ref 8.4–10.5)
Chloride: 102 mEq/L (ref 96–112)
Creatinine, Ser: 0.67 mg/dL (ref 0.50–1.35)
GFR calc Af Amer: >90 mL/min (ref >90)
GFR calc non Af Amer: >90 mL/min (ref >90)
Glucose, Bld: 133 mg/dL — ABNORMAL HIGH (ref 70–99)
Potassium: 4.2 mEq/L (ref 3.7–5.3)
SODIUM: 149 meq/L — AB (ref 137–147)

## 2013-09-29 NOTE — Progress Notes (Signed)
[facility]                                                                                              Progress note     Rowe Demographics  Ralph Rowe, is a 63 y.o. male  ZOX:[id number]  [id number]:[id number]  DOB - 10-26-1950  Admit date - 08/13/2013  Admitting Physician Ralph MorrisonAhmad B Barakat, MD  Outpatient Primary MD for the Rowe is Ralph Rowe  LOS - 47   CC    Respiratory failure    Right hydropneumothorax     Subjective:   Ralph MustacheLarry Rowe denies chest pains, shortness of breath, nausea or vomiting. Denies fever or chills  Objective:   Vital signs  Temperature 97.4 Heart rate 81 Respiratory rate 18 Blood pressure 114/76 Pulse ox 95%  Exam Confused Ralph new F.N deficits, flat affect Ralph Rowe.AT  Supple Neck,Ralph JVD, Ralph cervical lymphadenopathy appriciated.  Tracheostomy noted Symmetrical Chest wall movement, Good air movement bilaterally, decreased breath sounds bilaterally RRR,Ralph Gallops,Rubs or new Murmurs, Ralph Parasternal Heave +ve B.Sounds, Abd Soft, Non tender, Ralph organomegaly appriciated, Ralph rebound - guarding or rigidity. PEG tube noted Ralph Cyanosis, Clubbing or edema except in the right upper extremity, Ralph new Rash or bruise     I&Os 2980/2100 Peg Tube yes Foley yes  Trach #6 Shiley  Data Review   CBC  Recent Labs Lab 09/28/13 0500  WBC 20.2*  HGB 9.2*  HCT 30.1*  PLT 371  MCV 91.8  MCH 28.0  MCHC 30.6  RDW 18.9*    Chemistries   Recent Labs Lab 09/28/13 0500  NA 149*  K 4.2  CL 102  CO2 34*  GLUCOSE 133*  BUN 66*  CREATININE 0.67  CALCIUM 9.1   Coagulation profile Ralph results found for this basename: INR, PROTIME,  in the last 168 hours  Assessment & Plan    Respiratory failure; continue with ATC 30%,  Sepsis resolved Hospital-acquired pneumonia  Status post multiple antibiotic  courses now on cefepime and amikacin A. fib with RVR,  status post echocardiogram showing ejection fraction of 70% rate controlled Right cerebellar infarct continue with aspirin and Lipitor Gastritis/esophagitis status post EGD 07/2013 Protein calorie malnutrition continue with Glucerna 1.5 per tube Generalized weakness continue with PT/OT Demand ischemia Ralph Rowe; continue with Rivastigmine  paranoid schizophrenia Hypothyroidism continue with Synthroid Diabetes mellitus; blood sugars are fairly controlled Hydro-pneumothorax status post placement and removal of chest tubes Plan DC cefepime and amikacin  Code Status: Limited   DVT Prophylaxis  SCDs   Carron CurieHijazi, Sophee Mckimmy M.D on 09/29/2013 at 7:51 PM

## 2013-09-30 NOTE — Progress Notes (Signed)
[facility]                                                                                              Progress note     Patient Demographics  Ralph Rowe, is a 63 y.o. male  UJW:[id number]  [id number]  DOB - Oct 22, 1950  Admit date - 08/13/2013  Admitting Physician Elnora MorrisonAhmad B Barakat, MD  Outpatient Primary MD for the patient is No PCP Per Patient  LOS - 48   CC    Respiratory failure    Right hydropneumothorax     Subjective:   Ralph MustacheLarry Buczek denies chest pains, shortness of breath, nausea or vomiting. Denies fever or chills  Objective:   Vital signs  Temperature 97.9 Heart rate 74 Respiratory rate 20 Blood pressure 100/52 Pulse ox 9 8 %  Exam Confused No new F.N deficits, flat affect [place].AT  Supple Neck,No JVD, No cervical lymphadenopathy appriciated.  Tracheostomy noted Symmetrical Chest wall movement, Good air movement bilaterally, decreased breath sounds bilaterally RRR,No Gallops,Rubs or new Murmurs, No Parasternal Heave +ve B.Sounds, Abd Soft, Non tender, No organomegaly appriciated, No rebound - guarding or rigidity. PEG tube noted No Cyanosis, Clubbing or edema except in the right upper extremity, No new Rash or bruise     I&Os to 160/725 Peg Tube yes Foley yes  Trach #6 Shiley  Data Review   CBC  Recent Labs Lab 09/28/13 0500  WBC 20.2*  HGB 9.2*  HCT 30.1*  PLT 371  MCV 91.8  MCH 28.0  MCHC 30.6  RDW 18.9*    Chemistries   Recent Labs Lab 09/28/13 0500  NA 149*  K 4.2  CL 102  CO2 34*  GLUCOSE 133*  BUN 66*  CREATININE 0.67  CALCIUM 9.1   Coagulation profile No results found for this basename: INR, PROTIME,  in the last 168 hours  Assessment & Plan    Respiratory failure; continue with ATC 30%,  Sepsis resolved Hospital-acquired pneumonia  Status post multiple antibiotic  courses now on cefepime and amikacin A. fib with RVR,  status post echocardiogram showing ejection fraction of 70% rate controlled Right cerebellar infarct continue with aspirin and Lipitor Gastritis/esophagitis status post EGD 07/2013 Protein calorie malnutrition continue with Glucerna 1.5 per tube Generalized weakness continue with PT/OT Demand ischemia Louie body dementia; continue with Rivastigmine  paranoid schizophrenia Hypothyroidism continue with Synthroid Diabetes mellitus; blood sugars are fairly controlled Hydro-pneumothorax status post placement and removal of chest tubes Hypernatremia Plan Check CBC and BMP today  Code Status: Limited   DVT Prophylaxis  SCDs   Carron CurieHijazi, Adwoa Axe M.D on 09/30/2013 at 11:15 AM

## 2013-10-01 LAB — CBC
HEMATOCRIT: 27.7 % — AB (ref 39.0–52.0)
Hemoglobin: 8.5 g/dL — ABNORMAL LOW (ref 13.0–17.0)
MCH: 28.6 pg (ref 26.0–34.0)
MCHC: 30.7 g/dL (ref 30.0–36.0)
MCV: 93.3 fL (ref 78.0–100.0)
Platelets: 373 10*3/uL (ref 150–400)
RBC: 2.97 MIL/uL — ABNORMAL LOW (ref 4.22–5.81)
RDW: 19.6 % — AB (ref 11.5–15.5)
WBC: 16.7 10*3/uL — ABNORMAL HIGH (ref 4.0–10.5)

## 2013-10-01 LAB — BASIC METABOLIC PANEL
BUN: 59 mg/dL — AB (ref 6–23)
CO2: 31 mEq/L (ref 19–32)
CREATININE: 0.61 mg/dL (ref 0.50–1.35)
Calcium: 8.6 mg/dL (ref 8.4–10.5)
Chloride: 109 mEq/L (ref 96–112)
GFR calc non Af Amer: >90 mL/min (ref >90)
GLUCOSE: 206 mg/dL — AB (ref 70–99)
Potassium: 3.4 mEq/L — ABNORMAL LOW (ref 3.7–5.3)
Sodium: 151 mEq/L — ABNORMAL HIGH (ref 137–147)

## 2013-10-01 LAB — AMIKACIN LEVEL: AMIKACIN RM: 21.3 mg/L

## 2013-10-01 NOTE — Progress Notes (Signed)
[facility]                                                                                              Progress note     Patient Demographics  Ralph Rowe, is a 63 y.o. male  ZOX:[id number]  WJX:[id number]  DOB - 17-Oct-1950  Admit date - 08/13/2013  Admitting Physician Elnora MorrisonAhmad B Barakat, MD  Outpatient Primary MD for the patient is No PCP Per Patient  LOS - 49   CC    Respiratory failure    Right hydropneumothorax     Subjective:   Ralph MustacheLarry Taher denies chest pains, shortness of breath, nausea or vomiting. Denies fever or chills  Objective:   Vital signs  Temperature 99.7 Heart rate 98 Respiratory rate 22 Blood pressure 93/55 Pulse ox 100%  Exam Confused No new F.N deficits, flat affect .AT  Supple Neck,No JVD, No cervical lymphadenopathy appriciated.  Tracheostomy noted Symmetrical Chest wall movement, Good air movement bilaterally, decreased breath sounds bilaterally RRR,No Gallops,Rubs or new Murmurs, No Parasternal Heave +ve B.Sounds, Abd Soft, Non tender, No organomegaly appriciated, No rebound - guarding or rigidity. PEG tube noted No Cyanosis, Clubbing or edema except in the right upper extremity, No new Rash or bruise     I&Os to 2592/2450 Peg Tube yes Foley yes  Trach #6 [place]  Data Review   CBC  Recent Labs Lab 09/28/13 0500 10/01/13 0500  WBC 20.2* 16.7*  HGB 9.2* 8.5*  HCT 30.1* 27.7*  PLT 371 373  MCV 91.8 93.3  MCH 28.0 28.6  MCHC 30.6 30.7  RDW 18.9* 19.6*    Chemistries   Recent Labs Lab 09/28/13 0500 10/01/13 0500  NA 149* 151*  K 4.2 3.4*  CL 102 109  CO2 34* 31  GLUCOSE 133* 206*  BUN 66* 59*  CREATININE 0.67 0.61  CALCIUM 9.1 8.6   Coagulation profile No results found for this basename: INR, PROTIME,  in the last 168 hours  Assessment & Plan    Respiratory failure; continue with ATC 30%,  Sepsis  resolved Hospital-acquired pneumonia  Status post multiple antibiotic  off antibiotics A. fib with RVR, status post echocardiogram showing ejection fraction of 70% rate controlled Right cerebellar infarct continue with aspirin and Lipitor Gastritis/esophagitis status post EGD 07/2013 Protein calorie malnutrition continue with Glucerna 1.5 per tube Generalized weakness continue with PT/OT Demand ischemia Louie body dementia; continue with Rivastigmine  paranoid schizophrenia Hypothyroidism continue with Synthroid Diabetes mellitus; blood sugars are fairly controlled Hydro-pneumothorax status post placement and removal of chest tubes Hypernatremia sodium 151 Plan Check labs in a.m. Continue IV fluids  Code Status: Limited Prognosis: Grave   DVT Prophylaxis  SCDs   Carron CurieHijazi, Joia Doyle M.D on 10/01/2013 at 6:16 PM

## 2013-10-02 LAB — CBC
HCT: 26.6 % — ABNORMAL LOW (ref 39.0–52.0)
Hemoglobin: 8.1 g/dL — ABNORMAL LOW (ref 13.0–17.0)
MCH: 28.4 pg (ref 26.0–34.0)
MCHC: 30.5 g/dL (ref 30.0–36.0)
MCV: 93.3 fL (ref 78.0–100.0)
PLATELETS: 360 10*3/uL (ref 150–400)
RBC: 2.85 MIL/uL — AB (ref 4.22–5.81)
RDW: 19.6 % — AB (ref 11.5–15.5)
WBC: 13.8 10*3/uL — ABNORMAL HIGH (ref 4.0–10.5)

## 2013-10-02 LAB — BASIC METABOLIC PANEL
BUN: 45 mg/dL — ABNORMAL HIGH (ref 6–23)
CALCIUM: 8.4 mg/dL (ref 8.4–10.5)
CO2: 31 meq/L (ref 19–32)
CREATININE: 0.53 mg/dL (ref 0.50–1.35)
Chloride: 103 mEq/L (ref 96–112)
GFR calc non Af Amer: >90 mL/min (ref >90)
Glucose, Bld: 194 mg/dL — ABNORMAL HIGH (ref 70–99)
Potassium: 3.3 mEq/L — ABNORMAL LOW (ref 3.7–5.3)
SODIUM: 142 meq/L (ref 137–147)

## 2013-10-02 NOTE — Progress Notes (Signed)
[facility]                                                                                              Progress note     Patient Demographics  Ralph Rowe, is a 63 y.o. male  XBM:[id number]  [id number]  DOB - 31-Dec-1950  Admit date - 08/13/2013  Admitting Physician Elnora MorrisonAhmad B Barakat, MD  Outpatient Primary MD for the patient is No PCP Per Patient  LOS - 50   CC    Respiratory failure    Right hydropneumothorax     Subjective:   Ralph MustacheLarry Cristo denies chest pains, shortness of breath, nausea or vomiting. Denies fever or chills  Objective:   Vital signs  Temperature 98.4 Heart rate 64 Respiratory rate 24 Blood pressure 100/61 Pulse ox 100%  Exam Confused No new F.N deficits, flat affect [place].AT  Supple Neck,No JVD, No cervical lymphadenopathy appriciated.  Tracheostomy noted Symmetrical Chest wall movement, Good air movement bilaterally, decreased breath sounds bilaterally RRR,No Gallops,Rubs or new Murmurs, No Parasternal Heave +ve B.Sounds, Abd Soft, Non tender, No organomegaly appriciated, No rebound - guarding or rigidity. PEG tube noted No Cyanosis, Clubbing or edema except in the right upper extremity, No new Rash or bruise     I&Os to 10/18/2007/2180 Peg Tube yes Foley yes  Trach #6 Shiley  Data Review   CBC  Recent Labs Lab 09/28/13 0500 10/01/13 0500 10/02/13 0500  WBC 20.2* 16.7* 13.8*  HGB 9.2* 8.5* 8.1*  HCT 30.1* 27.7* 26.6*  PLT 371 373 360  MCV 91.8 93.3 93.3  MCH 28.0 28.6 28.4  MCHC 30.6 30.7 30.5  RDW 18.9* 19.6* 19.6*    Chemistries   Recent Labs Lab 09/28/13 0500 10/01/13 0500 10/02/13 0500  NA 149* 151* 142  K 4.2 3.4* 3.3*  CL 102 109 103  CO2 34* 31 31  GLUCOSE 133* 206* 194*  BUN 66* 59* 45*  CREATININE 0.67 0.61 0.53  CALCIUM 9.1 8.6 8.4   Coagulation profile No results found for this basename: INR, PROTIME,  in the last  168 hours  Assessment & Plan    Respiratory failure; continue with ATC 28%,  Sepsis resolved Hospital-acquired pneumonia  Status post multiple antibiotic  off antibiotics A. fib with RVR, status post echocardiogram showing ejection fraction of 70% rate controlled Right cerebellar infarct continue with aspirin and Lipitor Gastritis/esophagitis status post EGD 07/2013 Protein calorie malnutrition continue with Glucerna 1.5 per tube Generalized weakness continue with PT/OT Demand ischemia Louie body dementia; continue with Rivastigmine  paranoid schizophrenia Hypothyroidism continue with Synthroid Diabetes mellitus; blood sugars are fairly controlled Hydro-pneumothorax status post placement and removal of chest tubes Hypernatremia resolved Plan Probable discharge in a.m.  Code Status: Limited Prognosis: Grave   DVT Prophylaxis  SCDs   Carron CurieHijazi, Hilario Robarts M.D on 10/02/2013 at 12:58 PM

## 2013-10-28 LAB — AFB CULTURE WITH SMEAR (NOT AT ARMC): Acid Fast Smear: NONE SEEN

## 2014-01-14 DEATH — deceased

## 2015-01-22 IMAGING — CR DG CHEST 1V PORT
1 series · 1 of 1 positions shown · non-contrast
Comparison: Portable exam 7288 hr compared to 08/16/2013

CLINICAL DATA: Post tracheotomy

EXAM:
PORTABLE CHEST - 1 VIEW

[AP]
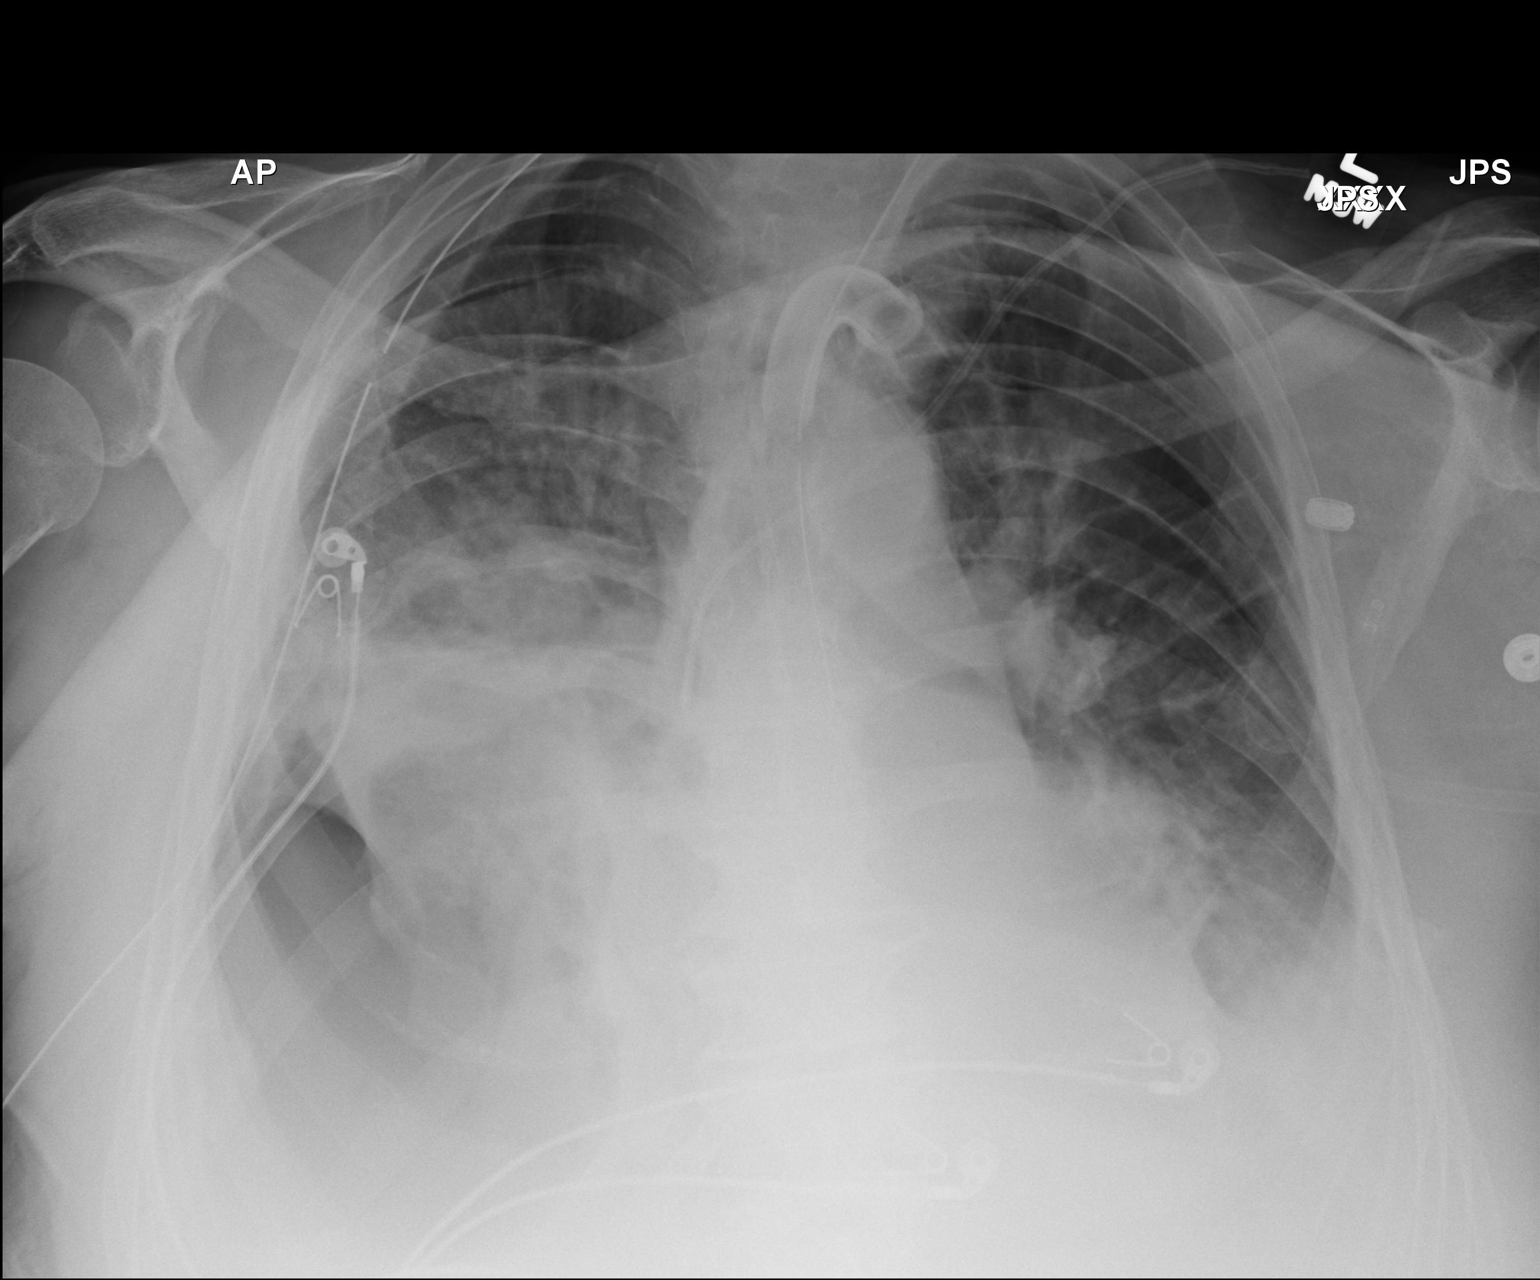

[1 of 1 positions shown; findings below may reference images not displayed]

FINDINGS: New tracheostomy tube with tip projecting 4.6 cm above carina.

Left jugular central venous catheter tip projecting over SVC.

Nasogastric tube extends only to the distal esophagus, recommend
advancing tube 15 cm to place proximal side-port at the stomach.

Right thoracostomy tube present.

Stable heart size.

Pulmonary vascular congestion.

Extensive bibasilar opacities again identified.

New pneumothorax at right base with significant atelectasis of the
lower right lung.

No mediastinal shift/tension.

Bones unremarkable.
IMPRESSION: New large right basilar pneumothorax despite right thoracostomy tube
with significant atelectasis at lower right lung but no evidence of
tension pneumothorax/mediastinal shift.

Persistent bibasilar infiltrates

Tip of nasogastric tube projects over the distal esophagus,
recommend advancing tube 15 cm to place proximal slight poor within
the stomach.

Critical Value/emergent results were called by telephone at the time
of interpretation on 08/20/2013 at [DATE] to Dr. Witte, who
verbally acknowledged these results.

## 2015-01-23 IMAGING — CR DG CHEST 1V PORT
1 series · 1 of 1 positions shown · non-contrast
Comparison: 08/20/2013

CLINICAL DATA: Respiratory failure

EXAM:
PORTABLE CHEST - 1 VIEW

[AP]
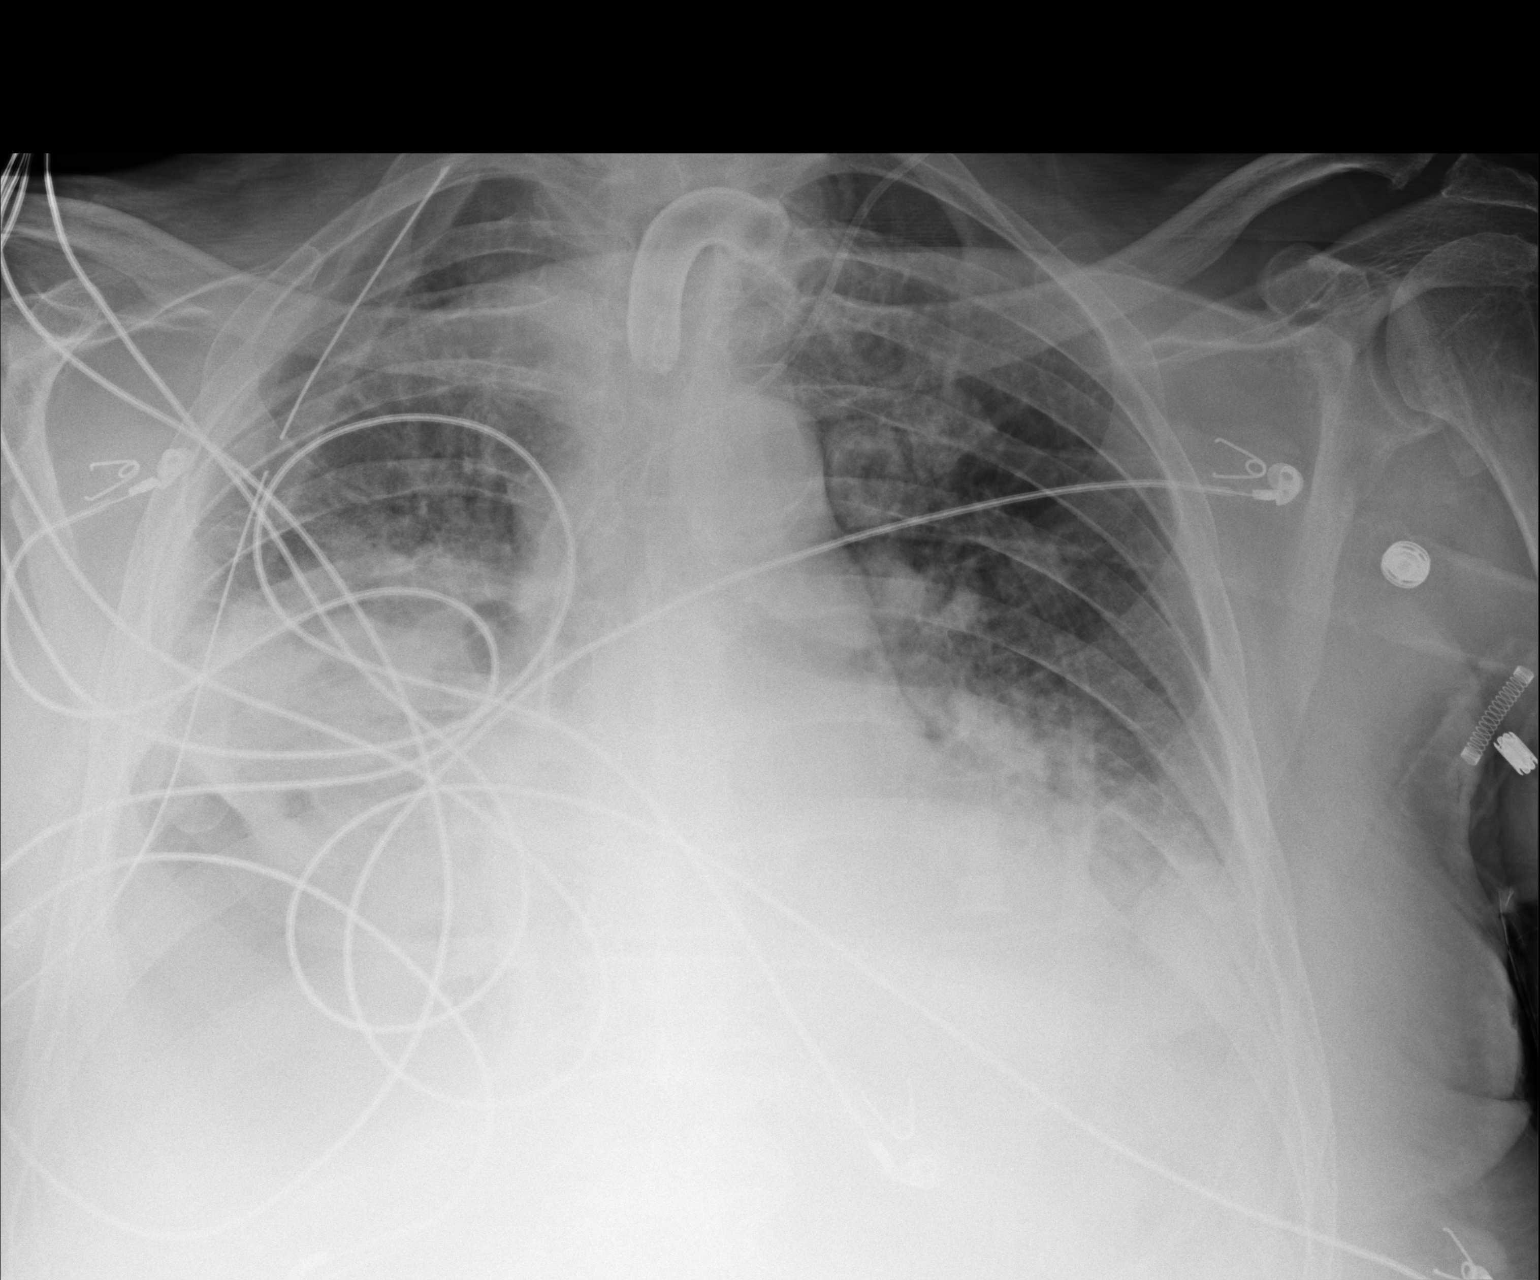

[1 of 1 positions shown; findings below may reference images not displayed]

FINDINGS: Postsurgical changes are noted with chest tube in position on the
right. Right basilar pneumothorax is again identified. Right basilar
infiltrative changes are again seen similar to that noted on the
prior exam. Some left basilar atelectasis is noted as well.
Tracheostomy catheter and left-sided jugular line are stable.
IMPRESSION: No change from the prior exam.

## 2015-01-31 IMAGING — CR DG CHEST 1V PORT
1 series · 1 of 1 positions shown · non-contrast
Comparison: DG CHEST 1V PORT dated 08/28/2013;

CLINICAL DATA: Chronic ventilator dependent respiratory failure
follow-up right basilar pneumothorax the pneumonia in both lungs

EXAM:
PORTABLE CHEST - 1 VIEW

[AP]
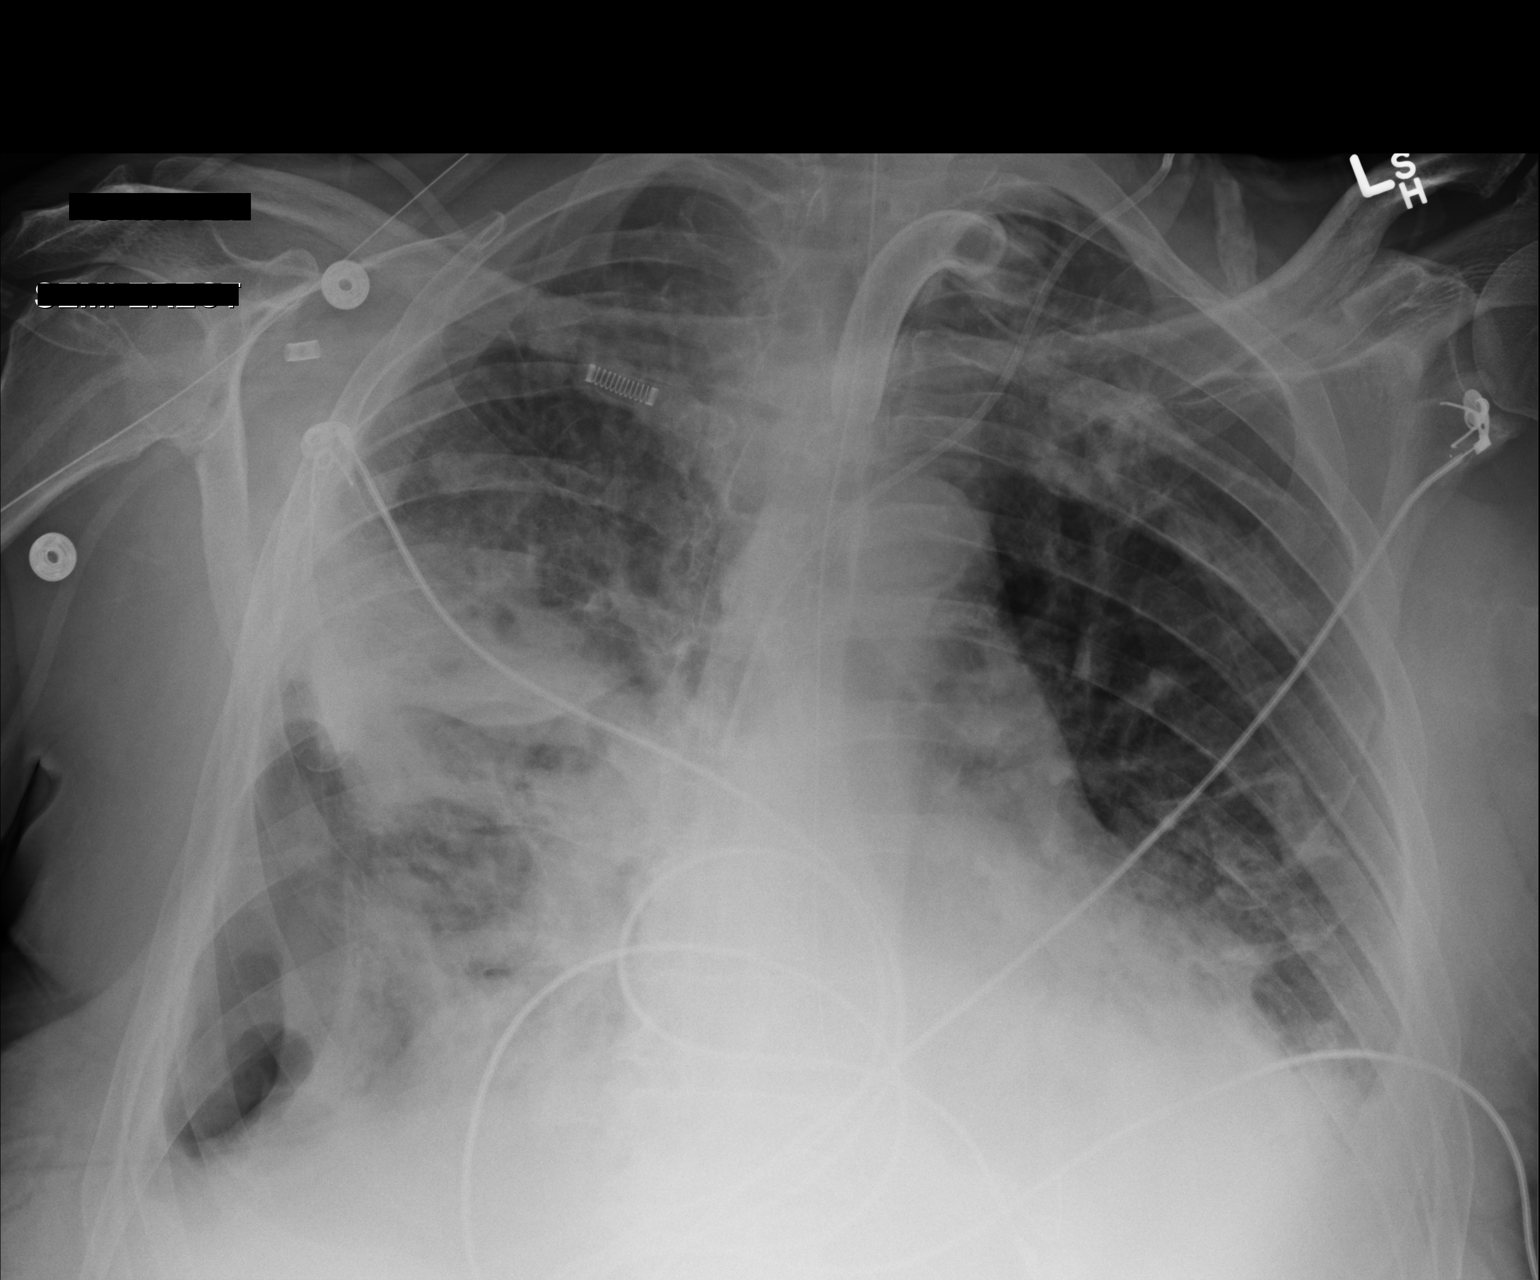

[1 of 1 positions shown; findings below may reference images not displayed]

DG CHEST 1V PORT
dated 08/27/2013; DG CHEST 1V PORT dated 08/26/2013; DG CHEST 1V PORT
dated 08/26/2013; CT CHEST W/O CM dated 08/15/2013; CT CHEST W/CM
dated 08/25/2013
FINDINGS: Loculated lateral hydropneumothorax on the right, increased in size
since yesterday. Fluid in the minor fissure. Airspace consolidation
throughout the right lung and at the left lung base, unchanged.
Airspace opacities in the left upper lobe to a lesser degree. No new
pulmonary parenchymal abnormalities. Cardiac silhouette upper normal
in size, unchanged. Pulmonary vascularity normal. Tracheostomy tube
tip in satisfactory position below the thoracic inlet approximately
6-7 cm above the carina. Left jugular since been is catheter tip
projects over the mid SVC. Nasogastric tube courses below the
diaphragm into the stomach.
IMPRESSION: 1. Support apparatus satisfactory.
2. Loculated right lateral hydropneumothorax, increased in size
since yesterday.
3. Stable pneumonia throughout both lungs. No new pulmonary
parenchymal abnormalities.

## 2015-02-02 IMAGING — CR DG CHEST 1V PORT
1 series · 1 of 1 positions shown · non-contrast
Comparison: DG CHEST 1V PORT dated 08/29/2013;

CLINICAL DATA: Pneumothorax

EXAM:
PORTABLE CHEST - 1 VIEW

[AP]
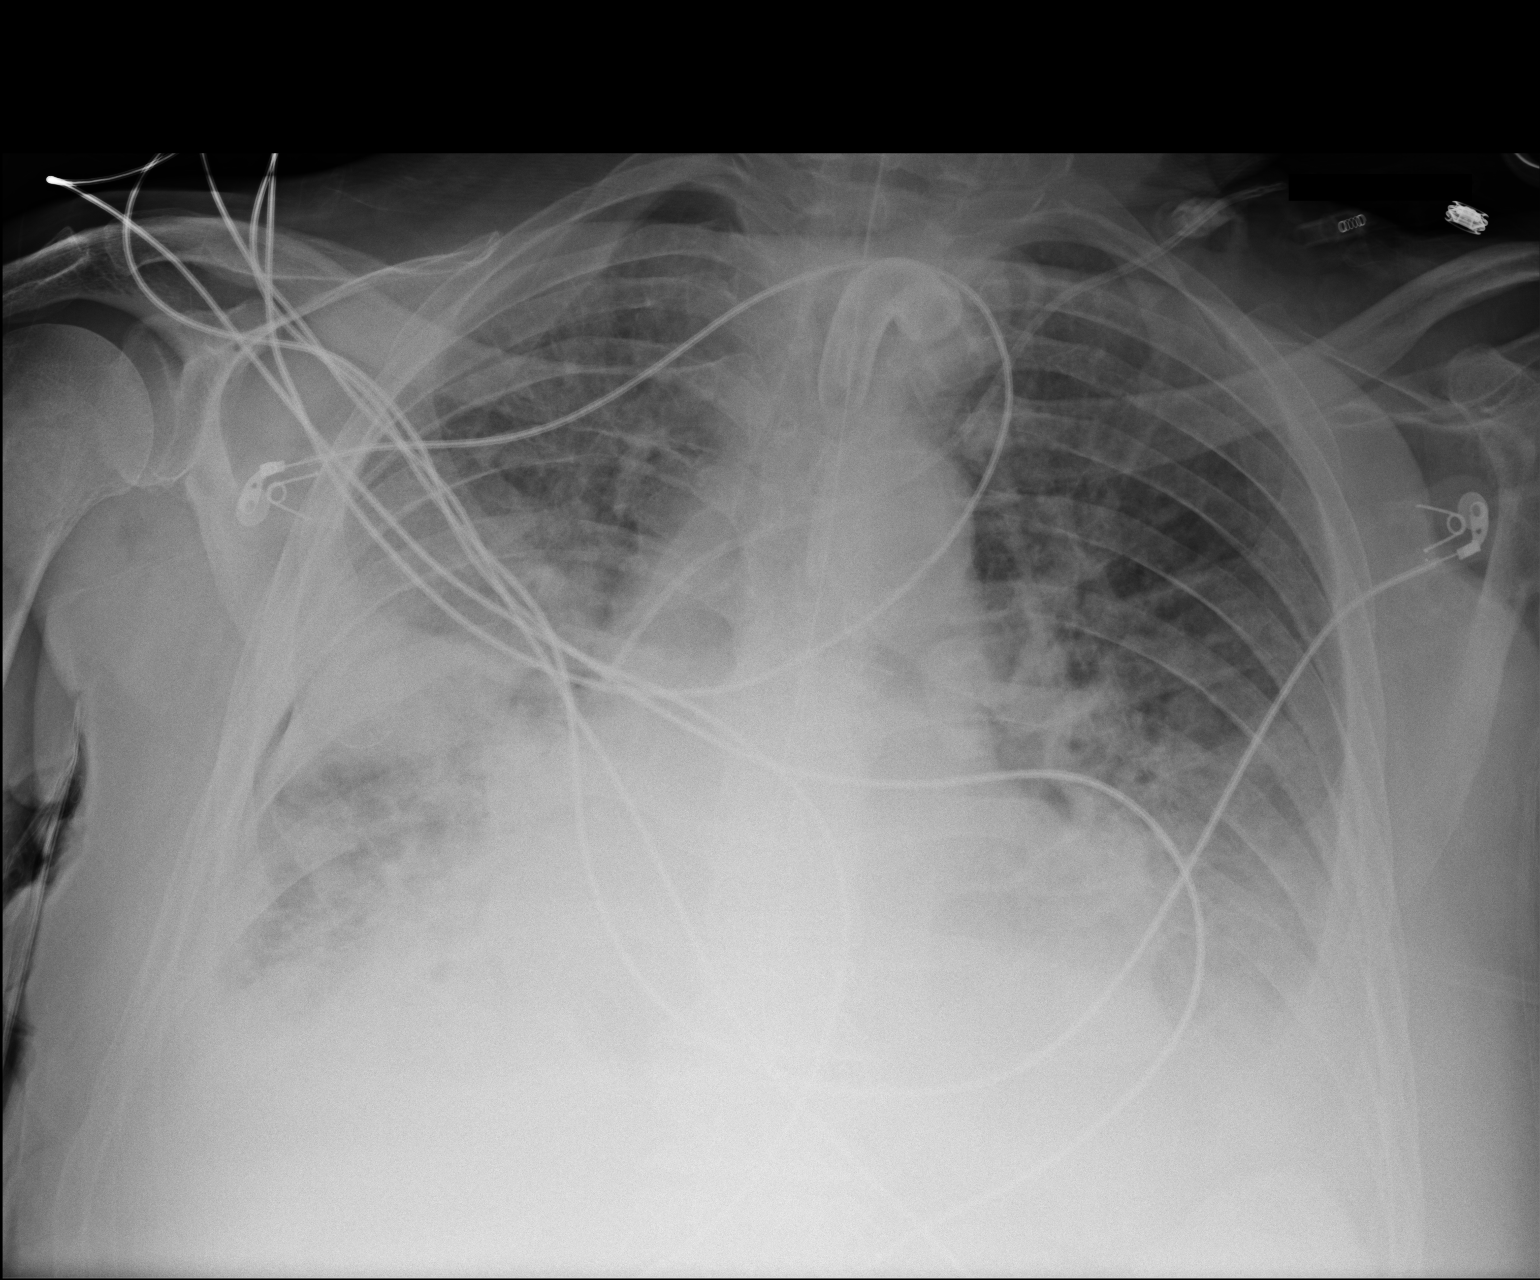

[1 of 1 positions shown; findings below may reference images not displayed]

DG CHEST 1V PORT
dated 08/26/2013; CT CHEST W/CM dated 08/25/2013; DG CHEST 1V PORT
dated 08/21/2013
FINDINGS: Grossly unchanged cardiac silhouette and mediastinal contours.
Stable position of support apparatus. There has been interval
reduction in the amount of air of previously noted loculated right
basilar hydro pneumothorax with trace amount of residual loculated
air about the peripheral aspect of the right mid lung. Worsening
bilateral pleural effusions and associated bibasilar opacities.
Pulmonary vasculature remains indistinct with cephalization of flow.
Grossly unchanged bones.
IMPRESSION: 1. Interval reduction in persistent trace amount of air within tiny
residual loculated right-sided hydro pneumothorax.
2.  Stable positioning of support apparatus.
3. Worsening pulmonary edema with now small to moderate size
bilateral effusions and associated bibasilar opacities, atelectasis
versus infiltrate.

## 2015-02-05 IMAGING — CR DG CHEST 1V PORT
1 series · 1 of 1 positions shown · non-contrast
Comparison: 08/31/2013

CLINICAL DATA: Respiratory failure.

EXAM:
PORTABLE CHEST - 1 VIEW

[AP]
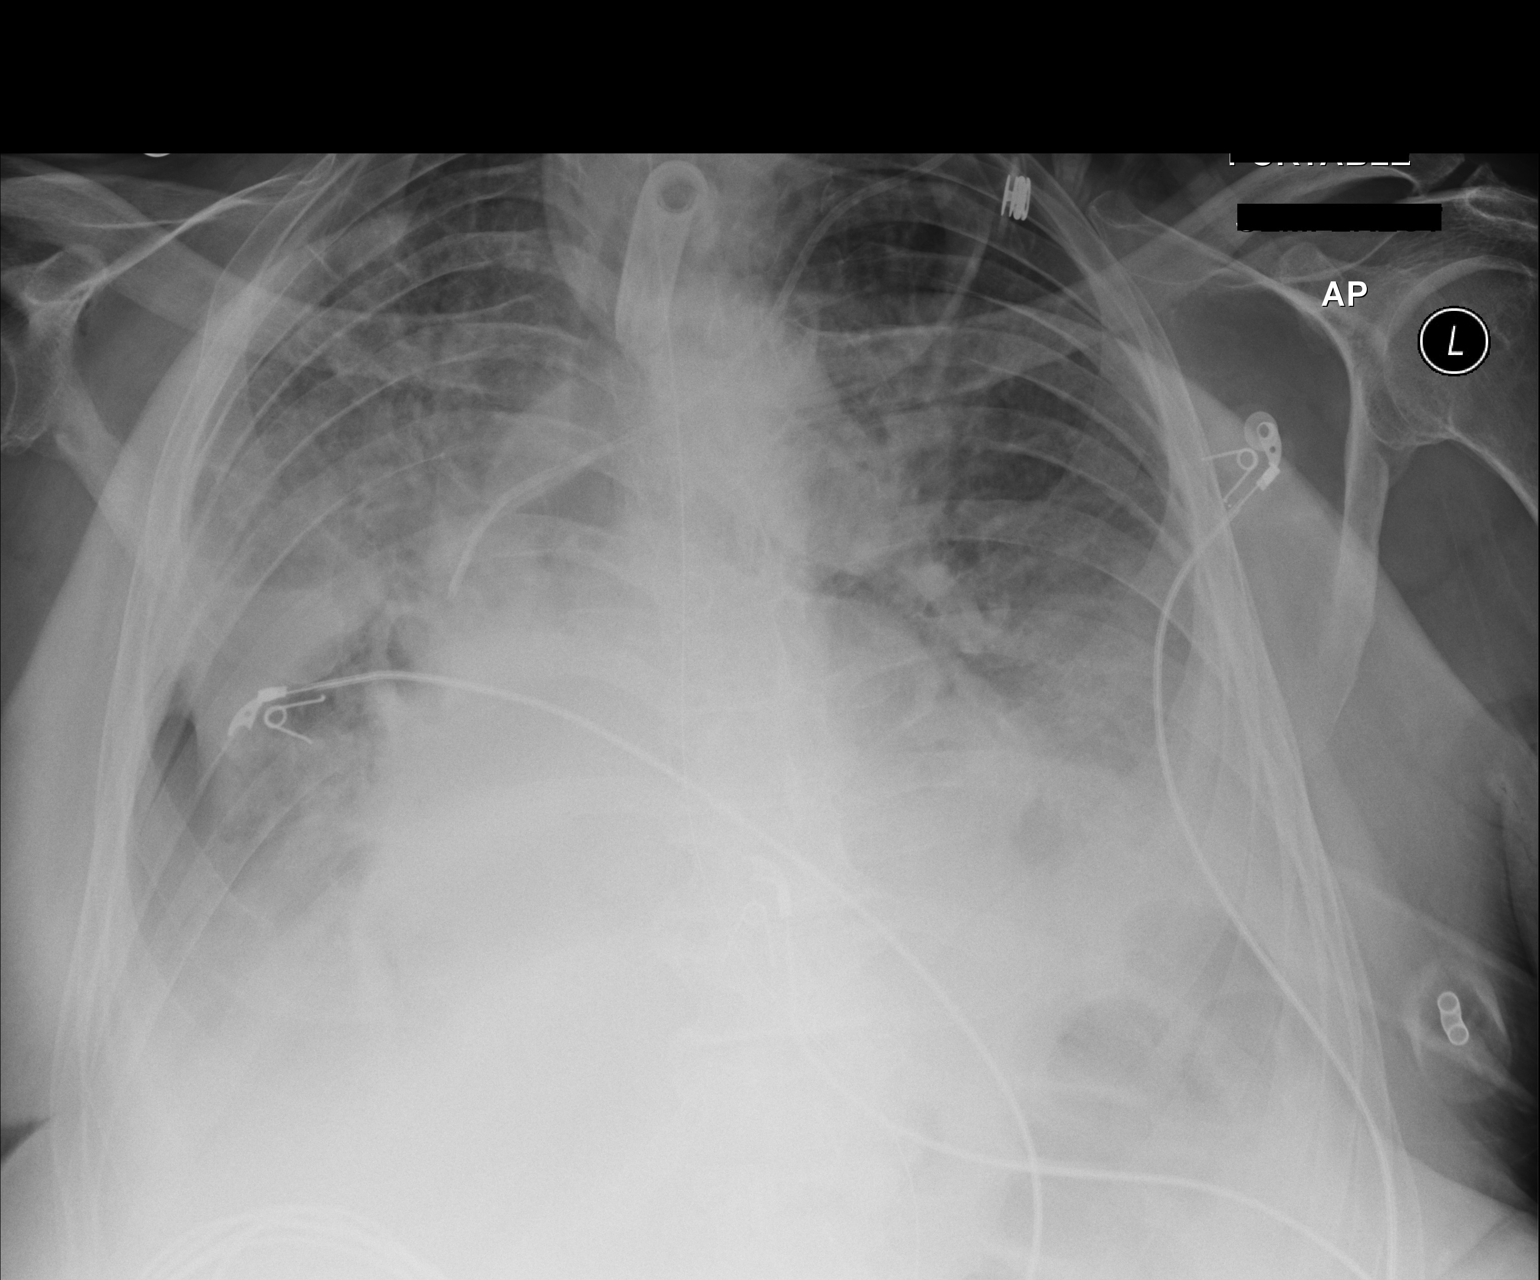

[1 of 1 positions shown; findings below may reference images not displayed]

FINDINGS: Tracheostomy tube overlies the airway. Left jugular central venous
catheter remains in place with tip overlying the mid SVC. Enteric
tube courses into the left upper abdomen. Cardiomediastinal
silhouette is grossly unchanged. Loculated right basilar
hydropneumothorax is again seen. Pleural air has mildly increased in
amount from the prior study but is less than that seen on the
08/29/2013 exam. Bilateral pleural effusions and bilateral basilar
predominant airspace opacities are grossly unchanged.
IMPRESSION: 1. Mildly increased air in loculated right hydropneumothorax.
2. Similar appearance of bilateral pleural effusions and pulmonary
edema.
